# Patient Record
Sex: Male | Born: 1955 | Race: White | Hispanic: No | Smoking: Never smoker
Health system: Southern US, Community
[De-identification: ages and names within clinical notes are randomized; demographics above are authoritative.]

## PROBLEM LIST (undated history)

## (undated) DIAGNOSIS — E119 Type 2 diabetes mellitus without complications: Secondary | ICD-10-CM

## (undated) DIAGNOSIS — E785 Hyperlipidemia, unspecified: Secondary | ICD-10-CM

## (undated) DIAGNOSIS — Z973 Presence of spectacles and contact lenses: Secondary | ICD-10-CM

## (undated) DIAGNOSIS — C801 Malignant (primary) neoplasm, unspecified: Secondary | ICD-10-CM

## (undated) DIAGNOSIS — I519 Heart disease, unspecified: Secondary | ICD-10-CM

## (undated) DIAGNOSIS — M545 Low back pain, unspecified: Secondary | ICD-10-CM

## (undated) DIAGNOSIS — K219 Gastro-esophageal reflux disease without esophagitis: Secondary | ICD-10-CM

## (undated) DIAGNOSIS — I499 Cardiac arrhythmia, unspecified: Secondary | ICD-10-CM

## (undated) DIAGNOSIS — I219 Acute myocardial infarction, unspecified: Secondary | ICD-10-CM

## (undated) DIAGNOSIS — M1990 Unspecified osteoarthritis, unspecified site: Secondary | ICD-10-CM

## (undated) DIAGNOSIS — I2510 Atherosclerotic heart disease of native coronary artery without angina pectoris: Secondary | ICD-10-CM

## (undated) DIAGNOSIS — I10 Essential (primary) hypertension: Secondary | ICD-10-CM

## (undated) HISTORY — PX: GANGLION CYST EXCISION: SHX1691

## (undated) HISTORY — DX: Heart disease, unspecified: I51.9

## (undated) HISTORY — DX: Low back pain, unspecified: M54.50

## (undated) HISTORY — DX: Acute myocardial infarction, unspecified: I21.9

## (undated) HISTORY — DX: Type 2 diabetes mellitus without complications: E11.9

## (undated) HISTORY — PX: CARDIAC CATHETERIZATION: SHX172

## (undated) HISTORY — PX: TONSILLECTOMY: SUR1361

## (undated) HISTORY — PX: FINGER GANGLION CYST EXCISION: SHX1636

---

## 1998-01-09 ENCOUNTER — Inpatient Hospital Stay (HOSPITAL_COMMUNITY): Admission: EM | Admit: 1998-01-09 | Discharge: 1998-01-10 | Payer: Self-pay | Admitting: Emergency Medicine

## 2000-09-30 DIAGNOSIS — I219 Acute myocardial infarction, unspecified: Secondary | ICD-10-CM

## 2000-09-30 HISTORY — DX: Acute myocardial infarction, unspecified: I21.9

## 2000-09-30 HISTORY — PX: CARDIAC CATHETERIZATION: SHX172

## 2004-01-06 ENCOUNTER — Encounter: Admission: RE | Admit: 2004-01-06 | Discharge: 2004-01-06 | Payer: Self-pay | Admitting: Sports Medicine

## 2004-01-26 ENCOUNTER — Encounter: Admission: RE | Admit: 2004-01-26 | Discharge: 2004-01-26 | Payer: Self-pay | Admitting: Sports Medicine

## 2004-02-14 ENCOUNTER — Encounter: Admission: RE | Admit: 2004-02-14 | Discharge: 2004-02-14 | Payer: Self-pay | Admitting: Sports Medicine

## 2007-10-01 HISTORY — PX: SHOULDER ARTHROSCOPY: SHX128

## 2008-09-30 HISTORY — PX: ELBOW ARTHROSCOPY: SUR87

## 2010-09-30 DIAGNOSIS — C801 Malignant (primary) neoplasm, unspecified: Secondary | ICD-10-CM

## 2010-09-30 HISTORY — PX: PARTIAL NEPHRECTOMY: SHX414

## 2010-09-30 HISTORY — DX: Malignant (primary) neoplasm, unspecified: C80.1

## 2010-09-30 HISTORY — PX: ULNAR NERVE REPAIR: SHX2594

## 2010-12-31 ENCOUNTER — Ambulatory Visit
Admission: RE | Admit: 2010-12-31 | Discharge: 2010-12-31 | Disposition: A | Discharge status: Home / Self Care | Payer: BC Managed Care – PPO | Source: Ambulatory Visit | Attending: Orthopedic Surgery | Admitting: Orthopedic Surgery

## 2010-12-31 ENCOUNTER — Other Ambulatory Visit: Payer: Self-pay | Admitting: Orthopedic Surgery

## 2010-12-31 ENCOUNTER — Encounter (HOSPITAL_BASED_OUTPATIENT_CLINIC_OR_DEPARTMENT_OTHER)
Admission: RE | Admit: 2010-12-31 | Discharge: 2010-12-31 | Disposition: A | Discharge status: Home / Self Care | Payer: BC Managed Care – PPO | Source: Ambulatory Visit | Attending: Orthopedic Surgery | Admitting: Orthopedic Surgery

## 2010-12-31 DIAGNOSIS — Z01811 Encounter for preprocedural respiratory examination: Secondary | ICD-10-CM

## 2010-12-31 LAB — BASIC METABOLIC PANEL
Chloride: 96 mEq/L (ref 96–112)
Creatinine, Ser: 1.03 mg/dL (ref 0.4–1.5)
GFR calc non Af Amer: >60 mL/min (ref >60)
Potassium: 3.7 mEq/L (ref 3.5–5.1)

## 2011-01-03 ENCOUNTER — Ambulatory Visit (HOSPITAL_BASED_OUTPATIENT_CLINIC_OR_DEPARTMENT_OTHER)
Admission: RE | Admit: 2011-01-03 | Discharge: 2011-01-03 | Disposition: A | Discharge status: Home / Self Care | Payer: BC Managed Care – PPO | Source: Ambulatory Visit | Attending: Orthopedic Surgery | Admitting: Orthopedic Surgery

## 2011-01-03 DIAGNOSIS — Z01812 Encounter for preprocedural laboratory examination: Secondary | ICD-10-CM | POA: Insufficient documentation

## 2011-01-03 DIAGNOSIS — G5620 Lesion of ulnar nerve, unspecified upper limb: Secondary | ICD-10-CM | POA: Insufficient documentation

## 2011-01-03 DIAGNOSIS — I10 Essential (primary) hypertension: Secondary | ICD-10-CM | POA: Insufficient documentation

## 2011-01-03 DIAGNOSIS — I2510 Atherosclerotic heart disease of native coronary artery without angina pectoris: Secondary | ICD-10-CM | POA: Insufficient documentation

## 2011-01-08 NOTE — Op Note (Signed)
NAME:  Adam Roach, Adam Roach              ACCOUNT NO.:  [id number]  MEDICAL RECORD NO.:  [id number]          PATIENT TYPE:  LOCATION:                                 FACILITY:  PHYSICIAN:  Katy Fitch. Gianni Fuchs, M.D.      DATE OF BIRTH:  DATE OF PROCEDURE:  01/03/2011 DATE OF DISCHARGE:                              OPERATIVE REPORT   PREOPERATIVE DIAGNOSES:  Chronic left ulnar neuropathy with abnormal electrodiagnostic studies across the elbow and proximal forearm, repeated status post prior ulnar nerve decompression, anterior transposition, and "limited medial epicondylectomy."  POSTOPERATIVE DIAGNOSES:  Profound scarring of ulnar nerve to scar at medial epicondylectomy and extreme scarring and compression neuropathy with traction neuropathy of ulnar nerve at heads of flexor carpi ulnaris and relatively acute angle of entry into the head of the flexor carpi ulnaris due to scar contracture.  Also, we identified an intact medial brachial intermuscular septum that may have been causing proximal compression deep to the scar where the ulnar nerve crossed the medial condyle of the distal humerus.  OPERATION: 1. Re-exploration and extensive external neurolysis of left ulnar     nerve across medial left elbow, status post prior subcutaneous     anterior transposition of the ulnar nerve, and "limited medial     epicondylectomy." 2. Resection of persistent medial brachial intermuscular septum. 3. Creation of subcutaneous adipose tissue channel for transposed     ulnar nerve with extensive dissection of ulnar nerve at heads of     flexor carpi ulnaris, correcting a rather acute angulation of the     nerve upon entry into the head of flexor carpi ulnaris by resection     of the anterior fascia of the flexor carpi ulnaris leaving a muscle     bed adjacent to the ulnar nerve.  OPERATING SURGEON:  Katy Fitch. Cherith Tewell, MD.  ASSISTANT:  Annye Rusk, PA-C.  ANESTHESIA:  General by  LMA.  SUPERVISING ANESTHESIOLOGIST:  Zenon Mayo, MD.  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  He has had chronic impairment of his left arm following a shoulder procedure in which he had an indwelling plexus catheter for perioperative analgesia, complicated by infiltration of local anesthesia and development of a postoperative ulnar nerve impairment.  He subsequently had electrodiagnostic studies, which revealed evidence of left ulnar neuropathy.  On March 16, 2008, Dr. Stanford Scotland of [place], [facility] performed an in situ decompression of the nerve followed by anterior subcutaneous transposition and a "limited medial epicondylectomy."  Dr. Stanford Scotland created some type of soft tissue barrier to prevent posterior relocation of the nerve.  Postoperatively, [name] experienced weakness of pinching grasp in the left hand and persistent sensibility in the region of the proper ulnar distribution of the palm, ring, and small fingers.  He had subsequent electrodiagnostic studies completed in [facility], which revealed persistent slowing of the ulnar nerve across the elbow and abnormal electrical activity in the abductor digiti minimi.  He has tolerated this symptoms for more than 2-1/2 years, but was quite impaired at work due to  impairment of pinch and grasp and was bothered by his chronic dysesthesias.  He presented for an upper extremity orthopaedic consult on December 12, 2010 following a suggestion of some of our other patients who he was familiar with in [facility].  On examination, we noted weakness of pinch grasp and altered ulnar sensibility in the left hand.  He had a positive Tinel sign at the medial epicondyle, at the presumed location of his ulnar nerve, and a visible degree of intrinsic atrophy with a positive Wartenberg sign and borderline Froment sign.  We reviewed  multiple electrodiagnostic studies from [facility] and concluded that he had both objective impairment of ulnar nerve motor and sensory function on clinical examination as well as documented electrodiagnostic impairment persisting.  We advised [name] that at times there are technical issues with anterior transposition of the ulnar nerve and that while we could make no promises, we would be willing to reexplore his nerve in an effort to neurolyse the nerve, be certain there was no residual sites of compression and try to place the nerve in an adipose bed that would allow nerve gliding and possibly improve function.  As he had had chronic impairment, he agreed that he was willing to proceed at this time.  Once again, we reminded him that we have no spare parts and might find an injured nerve that simply will not recover due to its current state of ill health.  Under these circumstances, he is willing to proceed at this time with a best effort on our part to explore his nerve, neurolyse the nerve and create a more favorable bed for the nerve to reside in.  We pointed out that at times we will find that the medial brachial intermuscular septum can be a problem and in our own personal experience of re-exploring ulnar nerves issues at the heads of the flexor carpi ulnaris are often problematic.  Given the circumstance of a combined epicondylectomy and subcutaneous transposition, was of the opinion that we might find a very significant scared bed.  After informed consent, he was brought to the operating room at this time.  DESCRIPTION OF PROCEDURE:  [name] was interviewed by Dr. Sampson Goon of the Anesthesia Service and after detailed informed consent, selected general anesthesia by LMA technique.  Dr. Sampson Goon had discussed a possible block, however, due to his difficulties of the prior indwelling catheter, an uncertainty as to whether or not there is an element of  plexus dysfunction contributing to his neuropathy, I have recommended that this be deferred.  Mr. Tabora is brought to room #1 of the [facility] and placed in supine position on the operating table.  Under Dr. Jarrett Ables direct supervision, general anesthesia by LMA technique was induced.  The left arm was then prepped with Betadine soap solution and sterilely draped.  IV antibiotic were not provided due to multiple drug allergies. We will provide oral doxycycline as a prophylactic antibiotic postoperatively.  After routine surgical time-out, Mr. Fan left arm was exsanguinated with an Esmarch bandage and the arterial tourniquet was inflated to 220 mmHg.  Procedure commenced with a resection of the prior surgical scar. Meticulous subcutaneous dissection in virgin tissue proximally and distally allowed identification of the ulnar nerve adjacent to the triceps muscle superiorly, and deep to the head of the flexor carpi ulnaris distally.  We then performed a meticulous dissection from distal to proximal and proximal to distal overlying the nerve at an epineurial plane.  There was dense  scarring in the nerve at the proximal medial epicondyle and irregularity of the epicondyle where there are apparently had been some decortication with a rongeur.  The medial brachial intermuscular septum was intact and there was clearly compression where the nerve crossed the posterior aspect of the epicondyle and the septum. This was carefully neurolysed.  With extreme care, the nerve was followed distally and found be densely adherent over a 3-cm segment of the epicondyle at the fascia overlying the common flexor origin.  At the head of the flexor carpi ulnaris, the nerve had about a 60-degree acute angulation due to scarring at the heads of the flexor carpi ulnaris. This was carefully neurolysed and care was taken to preserve the motor branches to the flexor carpi ulnaris as well as  the articular branch. As we dissected proximally, I found a neuroma bulb on the posterior aspect of the nerve, which I simply left intact and elevated off the flexor origin fascia.  The nerve was mobilized over distance of approximately 6 cm with silicone vessel loops and transposed anteriorly. The bed of the prior transposition was carefully inspected and found be rather irregular at the site of new bone growth of the epicondylectomy. This was gently smoothed and care was taken to transpose the nerve to an adipose bed approximately 1 cm more anterior and ulnar in direction. The intermuscular septum was resected proximally with a cutting cautery and dissected over distance of 4 cm proximally to the metaphyseal vessels of the distal humerus.  Hemostasis achieved with bipolar cautery and saline irrigation.  The nerve was transposed including the small neuroma bulb noted into the bed of near normal appearing fat followed by creation of a wall posterior to the nerve by suturing the subcutaneous fat to the fascia of the flexor pronator origin.  Care was taken to use a Glorious Peach as well as my finger to palpate around the nerve and sure that there was at least 1 cm of free space around the nerve.  There was no evidence of kinking at the distal insertion into the head of the flexor carpi ulnaris after release of the fascia of the flexor carpi ulnaris and resection of the tendinous origin of the muscle over 1-cm wide area at the distal epicondyle.  Proximally, we assured that there was no compression where the nerve passed over the epicondyle.  The elbow was ranged 0 to 130 degrees flexion and no sign of kinking or other impairment was noted.  As a portion of our rehab strategy, we will begin immediate active range of motion exercises of the elbow on the first postoperative day.  The tourniquet was released and bleeding points were electrocauterized bipolar current.  The skin was repaired with  subcutaneous 4-0 Vicryl and interdermal 3-0 Prolene with Steri-Strips.  A 2% lidocaine was infiltrated for postop analgesia.  For aftercare, Mr. Galea was provided prescription for Percocet 5 mg one p.o. q.4-6 hours p.r.n. pain, 30 tablets without refill, also doxycycline 100 mg one p.o. b.i.d. x4 days as prophylactic antibiotic.  We will see him back for followup in 1 week for dressing change, discussion of his surgical findings, and initiation of postoperative rehabilitation program.     Katy Fitch. Laiklynn Raczynski, M.D.     RVS/MEDQ  D:  01/03/2011  T:  01/04/2011  Job:  [id number]  Electronically Signed by Josephine Igo M.D. on 01/08/2011 02:51:14 PM

## 2011-08-27 HISTORY — PX: PARTIAL NEPHRECTOMY: SHX414

## 2012-11-13 IMAGING — CR DG CHEST 2V
2 series · 2 of 2 positions shown · non-contrast
Comparison: None.

CLINICAL DATA: Preop.

CHEST - 2 VIEW

[w chest pa]
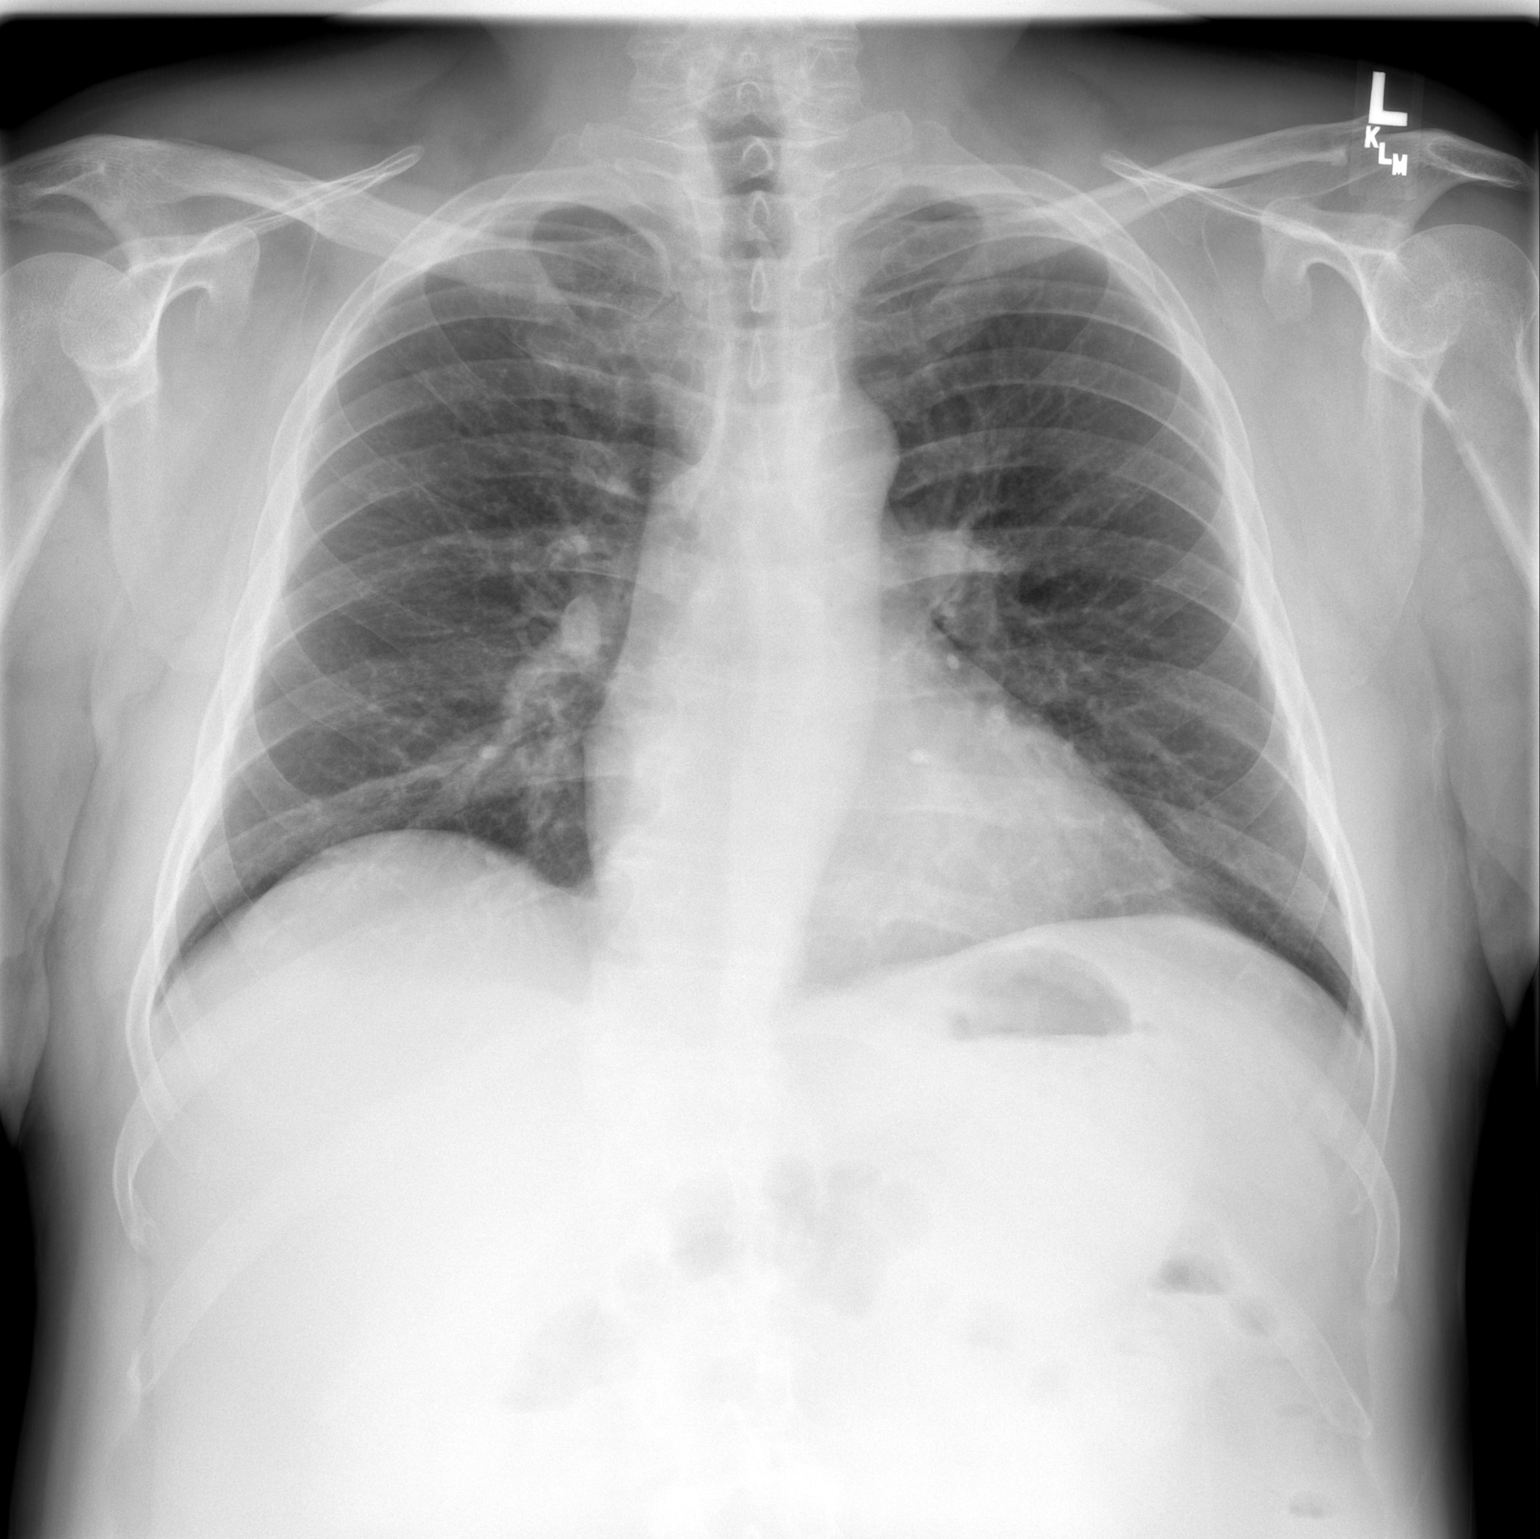

[w chest lat]
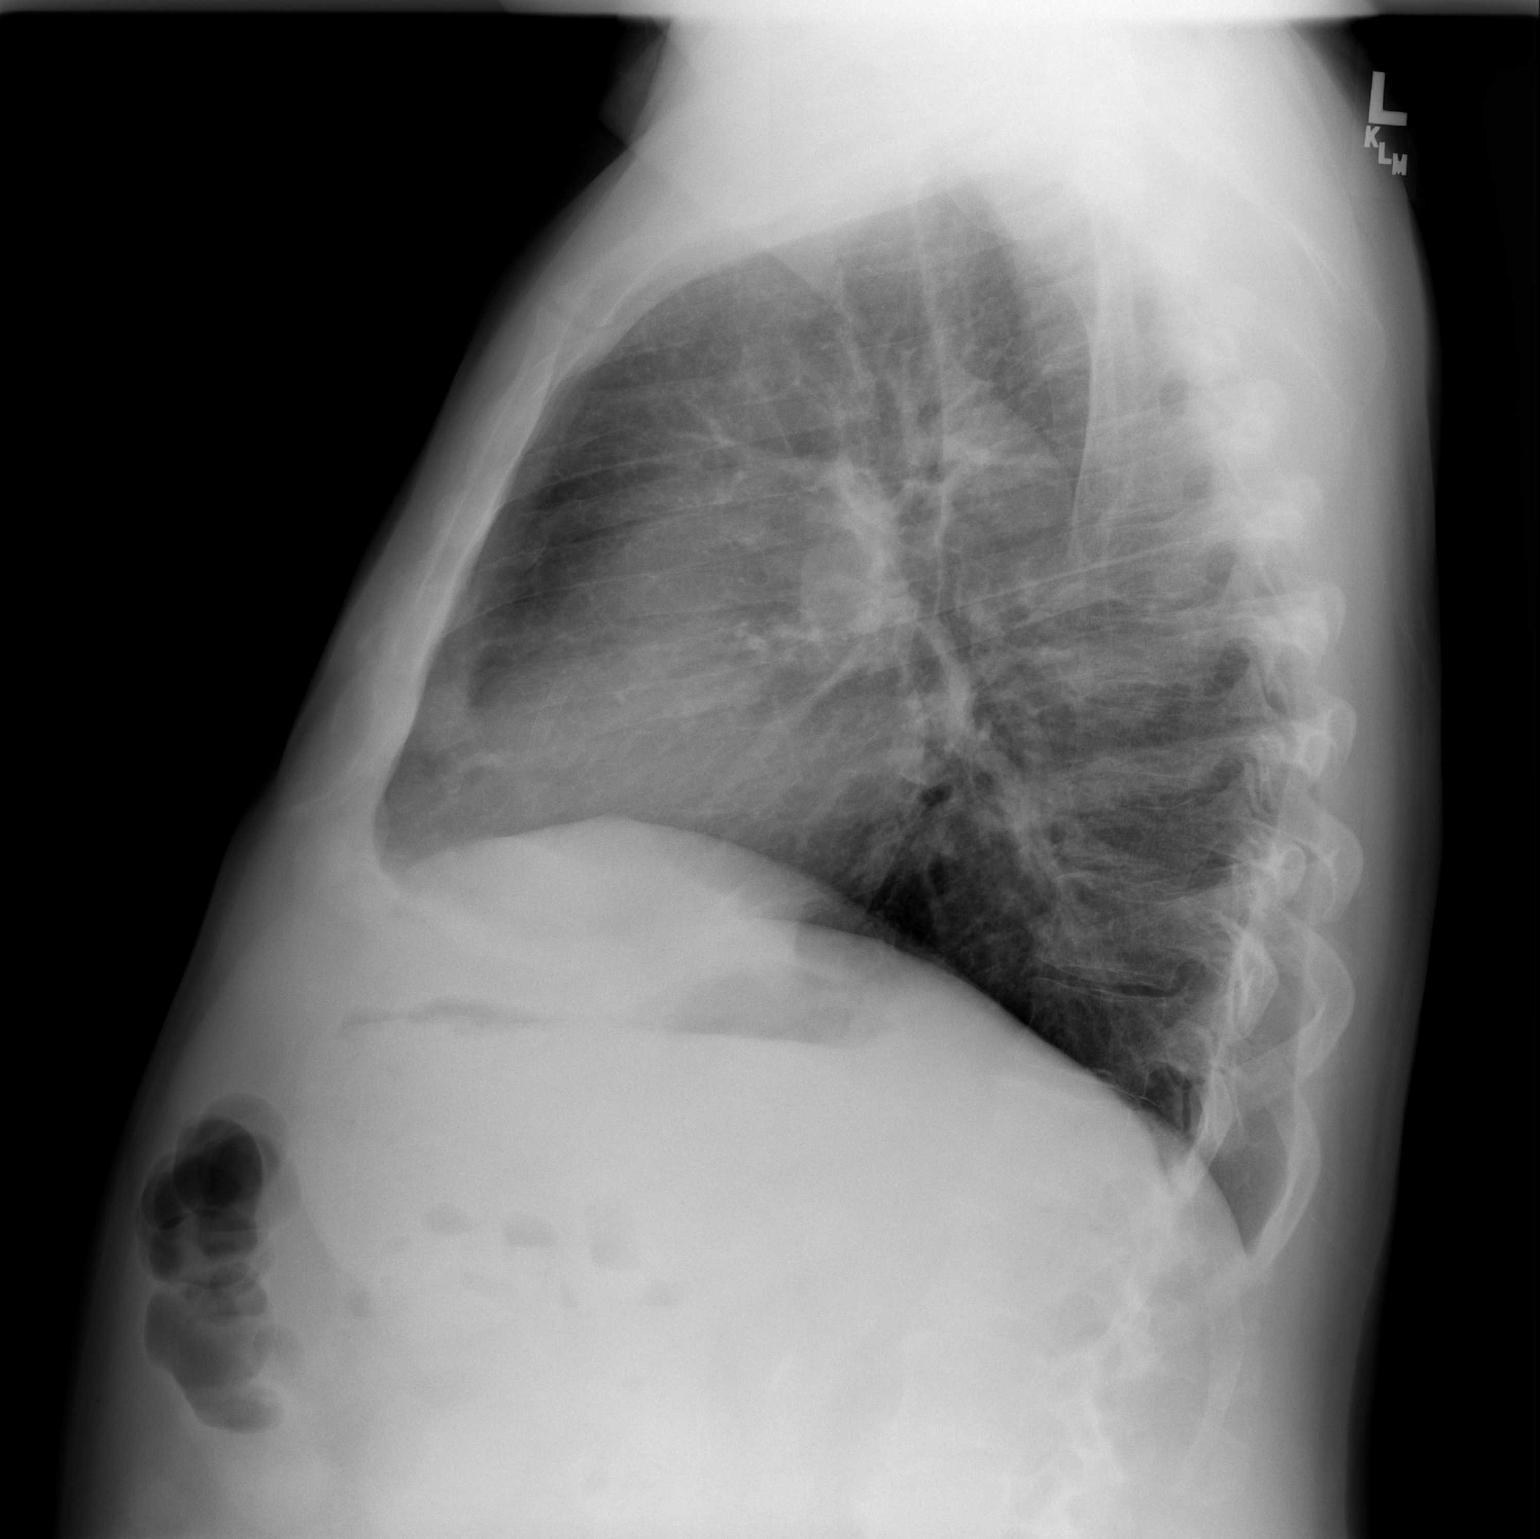

[2 of 2 positions shown; findings below may reference images not displayed]

FINDINGS: Trachea is midline.  Heart size normal.  Lungs are
somewhat low in volume but clear.  No pleural fluid.
IMPRESSION: No acute findings.

## 2014-12-13 ENCOUNTER — Other Ambulatory Visit: Payer: Self-pay | Admitting: Orthopedic Surgery

## 2014-12-14 ENCOUNTER — Encounter (HOSPITAL_BASED_OUTPATIENT_CLINIC_OR_DEPARTMENT_OTHER): Payer: Self-pay | Admitting: *Deleted

## 2014-12-14 NOTE — Progress Notes (Signed)
Pt had stents 2002-has been here several times for wrist and elbow surgeries-called for recent ekg-cardiac notes-will ned istat

## 2014-12-20 ENCOUNTER — Ambulatory Visit (HOSPITAL_BASED_OUTPATIENT_CLINIC_OR_DEPARTMENT_OTHER)
Admission: RE | Admit: 2014-12-20 | Discharge: 2014-12-20 | Disposition: A | Discharge status: Home / Self Care | Payer: BLUE CROSS/BLUE SHIELD | Source: Ambulatory Visit | Attending: Orthopedic Surgery | Admitting: Orthopedic Surgery

## 2014-12-20 ENCOUNTER — Encounter (HOSPITAL_BASED_OUTPATIENT_CLINIC_OR_DEPARTMENT_OTHER)
Admission: RE | Disposition: A | Discharge status: Home / Self Care | Payer: Self-pay | Source: Ambulatory Visit | Attending: Orthopedic Surgery

## 2014-12-20 ENCOUNTER — Encounter (HOSPITAL_BASED_OUTPATIENT_CLINIC_OR_DEPARTMENT_OTHER): Payer: Self-pay | Admitting: Certified Registered"

## 2014-12-20 ENCOUNTER — Ambulatory Visit (HOSPITAL_BASED_OUTPATIENT_CLINIC_OR_DEPARTMENT_OTHER): Payer: BLUE CROSS/BLUE SHIELD | Admitting: Certified Registered"

## 2014-12-20 DIAGNOSIS — Z955 Presence of coronary angioplasty implant and graft: Secondary | ICD-10-CM | POA: Diagnosis not present

## 2014-12-20 DIAGNOSIS — G5621 Lesion of ulnar nerve, right upper limb: Secondary | ICD-10-CM | POA: Insufficient documentation

## 2014-12-20 DIAGNOSIS — M1990 Unspecified osteoarthritis, unspecified site: Secondary | ICD-10-CM | POA: Diagnosis not present

## 2014-12-20 DIAGNOSIS — Z881 Allergy status to other antibiotic agents status: Secondary | ICD-10-CM | POA: Diagnosis not present

## 2014-12-20 DIAGNOSIS — G589 Mononeuropathy, unspecified: Secondary | ICD-10-CM | POA: Diagnosis not present

## 2014-12-20 DIAGNOSIS — Z7982 Long term (current) use of aspirin: Secondary | ICD-10-CM | POA: Diagnosis not present

## 2014-12-20 DIAGNOSIS — M67431 Ganglion, right wrist: Secondary | ICD-10-CM | POA: Diagnosis not present

## 2014-12-20 DIAGNOSIS — Z905 Acquired absence of kidney: Secondary | ICD-10-CM | POA: Insufficient documentation

## 2014-12-20 DIAGNOSIS — K219 Gastro-esophageal reflux disease without esophagitis: Secondary | ICD-10-CM | POA: Insufficient documentation

## 2014-12-20 DIAGNOSIS — Z91041 Radiographic dye allergy status: Secondary | ICD-10-CM | POA: Diagnosis not present

## 2014-12-20 DIAGNOSIS — E785 Hyperlipidemia, unspecified: Secondary | ICD-10-CM | POA: Diagnosis not present

## 2014-12-20 DIAGNOSIS — G5601 Carpal tunnel syndrome, right upper limb: Secondary | ICD-10-CM | POA: Diagnosis not present

## 2014-12-20 DIAGNOSIS — I2510 Atherosclerotic heart disease of native coronary artery without angina pectoris: Secondary | ICD-10-CM | POA: Diagnosis not present

## 2014-12-20 DIAGNOSIS — I10 Essential (primary) hypertension: Secondary | ICD-10-CM | POA: Diagnosis not present

## 2014-12-20 DIAGNOSIS — Z880 Allergy status to penicillin: Secondary | ICD-10-CM | POA: Diagnosis not present

## 2014-12-20 HISTORY — DX: Gastro-esophageal reflux disease without esophagitis: K21.9

## 2014-12-20 HISTORY — PX: CARPAL TUNNEL RELEASE: SHX101

## 2014-12-20 HISTORY — PX: ULNAR NERVE TRANSPOSITION: SHX2595

## 2014-12-20 HISTORY — DX: Hyperlipidemia, unspecified: E78.5

## 2014-12-20 HISTORY — PX: GANGLION CYST EXCISION: SHX1691

## 2014-12-20 HISTORY — DX: Atherosclerotic heart disease of native coronary artery without angina pectoris: I25.10

## 2014-12-20 HISTORY — DX: Unspecified osteoarthritis, unspecified site: M19.90

## 2014-12-20 HISTORY — DX: Malignant (primary) neoplasm, unspecified: C80.1

## 2014-12-20 HISTORY — DX: Presence of spectacles and contact lenses: Z97.3

## 2014-12-20 HISTORY — DX: Essential (primary) hypertension: I10

## 2014-12-20 LAB — POCT I-STAT, CHEM 8
BUN: 10 mg/dL (ref 6–23)
CALCIUM ION: 1.04 mmol/L — AB (ref 1.12–1.23)
Chloride: 101 mmol/L (ref 96–112)
Creatinine, Ser: 0.6 mg/dL (ref 0.50–1.35)
Glucose, Bld: 235 mg/dL — ABNORMAL HIGH (ref 70–99)
HEMATOCRIT: 43 % (ref 39.0–52.0)
Hemoglobin: 14.6 g/dL (ref 13.0–17.0)
POTASSIUM: 4 mmol/L (ref 3.5–5.1)
Sodium: 137 mmol/L (ref 135–145)
TCO2: 21 mmol/L (ref 0–100)

## 2014-12-20 SURGERY — ULNAR NERVE DECOMPRESSION/TRANSPOSITION
Anesthesia: General | Site: Wrist | Laterality: Right

## 2014-12-20 MED ORDER — FENTANYL CITRATE 0.05 MG/ML IJ SOLN
INTRAMUSCULAR | Status: DC | PRN
  Administered 2014-12-20: 50 ug via INTRAVENOUS

## 2014-12-20 MED ORDER — HYDROMORPHONE HCL 1 MG/ML IJ SOLN
INTRAMUSCULAR | Status: AC
  Filled 2014-12-20: qty 1

## 2014-12-20 MED ORDER — MIDAZOLAM HCL 2 MG/2ML IJ SOLN
INTRAMUSCULAR | Status: AC
  Filled 2014-12-20: qty 2

## 2014-12-20 MED ORDER — ONDANSETRON HCL 4 MG/2ML IJ SOLN
INTRAMUSCULAR | Status: DC | PRN
  Administered 2014-12-20: 4 mg via INTRAVENOUS

## 2014-12-20 MED ORDER — PROPOFOL 10 MG/ML IV EMUL
INTRAVENOUS | Status: AC
  Filled 2014-12-20: qty 50

## 2014-12-20 MED ORDER — LACTATED RINGERS IV SOLN
INTRAVENOUS | Status: DC
  Administered 2014-12-20: 08:00:00 via INTRAVENOUS

## 2014-12-20 MED ORDER — OXYCODONE HCL 5 MG PO TABS
5.0000 mg | ORAL_TABLET | Freq: Once | ORAL | Status: AC | PRN
  Administered 2014-12-20: 5 mg via ORAL

## 2014-12-20 MED ORDER — BUPIVACAINE HCL (PF) 0.25 % IJ SOLN
INTRAMUSCULAR | Status: AC
  Filled 2014-12-20: qty 30

## 2014-12-20 MED ORDER — BUPIVACAINE HCL (PF) 0.25 % IJ SOLN
INTRAMUSCULAR | Status: DC | PRN
  Administered 2014-12-20: 17 mL

## 2014-12-20 MED ORDER — LIDOCAINE HCL (CARDIAC) 20 MG/ML IV SOLN
INTRAVENOUS | Status: DC | PRN
  Administered 2014-12-20: 30 mg via INTRAVENOUS

## 2014-12-20 MED ORDER — CLINDAMYCIN PHOSPHATE 600 MG/50ML IV SOLN
INTRAVENOUS | Status: AC
  Filled 2014-12-20: qty 50

## 2014-12-20 MED ORDER — CLINDAMYCIN PHOSPHATE 600 MG/50ML IV SOLN
INTRAVENOUS | Status: DC | PRN
  Administered 2014-12-20: 600 mg via INTRAVENOUS

## 2014-12-20 MED ORDER — MIDAZOLAM HCL 2 MG/2ML IJ SOLN: 1.0000 mg | INTRAMUSCULAR | Status: DC | PRN

## 2014-12-20 MED ORDER — OXYCODONE HCL 5 MG PO TABS
ORAL_TABLET | ORAL | Status: AC
  Filled 2014-12-20: qty 1

## 2014-12-20 MED ORDER — HYDROMORPHONE HCL 1 MG/ML IJ SOLN
0.2500 mg | INTRAMUSCULAR | Status: DC | PRN
  Administered 2014-12-20 (×2): 0.5 mg via INTRAVENOUS
  Administered 2014-12-20: 0.25 mg via INTRAVENOUS

## 2014-12-20 MED ORDER — FENTANYL CITRATE 0.05 MG/ML IJ SOLN
INTRAMUSCULAR | Status: AC
  Filled 2014-12-20: qty 6

## 2014-12-20 MED ORDER — PROPOFOL 10 MG/ML IV BOLUS
INTRAVENOUS | Status: DC | PRN
  Administered 2014-12-20: 200 mg via INTRAVENOUS

## 2014-12-20 MED ORDER — CHLORHEXIDINE GLUCONATE 4 % EX LIQD: 60.0000 mL | Freq: Once | CUTANEOUS | Status: DC

## 2014-12-20 MED ORDER — MIDAZOLAM HCL 5 MG/5ML IJ SOLN
INTRAMUSCULAR | Status: DC | PRN
  Administered 2014-12-20: 2 mg via INTRAVENOUS

## 2014-12-20 MED ORDER — OXYCODONE HCL 5 MG/5ML PO SOLN: 5.0000 mg | Freq: Once | ORAL | Status: AC | PRN

## 2014-12-20 MED ORDER — CEFAZOLIN SODIUM-DEXTROSE 2-3 GM-% IV SOLR
INTRAVENOUS | Status: AC
  Filled 2014-12-20: qty 50

## 2014-12-20 MED ORDER — PROMETHAZINE HCL 25 MG/ML IJ SOLN: 6.2500 mg | INTRAMUSCULAR | Status: DC | PRN

## 2014-12-20 MED ORDER — OXYCODONE-ACETAMINOPHEN 5-325 MG PO TABS: ORAL_TABLET | ORAL | Status: DC

## 2014-12-20 MED ORDER — FENTANYL CITRATE 0.05 MG/ML IJ SOLN: 50.0000 ug | INTRAMUSCULAR | Status: DC | PRN

## 2014-12-20 MED ORDER — DEXAMETHASONE SODIUM PHOSPHATE 10 MG/ML IJ SOLN
INTRAMUSCULAR | Status: DC | PRN
  Administered 2014-12-20: 10 mg via INTRAVENOUS

## 2014-12-20 SURGICAL SUPPLY — 59 items
BANDAGE ELASTIC 3 VELCRO ST LF (GAUZE/BANDAGES/DRESSINGS) ×5 IMPLANT
BANDAGE ELASTIC 4 VELCRO ST LF (GAUZE/BANDAGES/DRESSINGS) ×3 IMPLANT
BLADE MINI RND TIP GREEN BEAV (BLADE) IMPLANT
BLADE SURG 15 STRL LF DISP TIS (BLADE) ×4 IMPLANT
BLADE SURG 15 STRL SS (BLADE) ×6
BNDG CMPR 9X4 STRL LF SNTH (GAUZE/BANDAGES/DRESSINGS) ×2
BNDG ESMARK 4X9 LF (GAUZE/BANDAGES/DRESSINGS) ×3 IMPLANT
BNDG GAUZE ELAST 4 BULKY (GAUZE/BANDAGES/DRESSINGS) ×3 IMPLANT
CHLORAPREP W/TINT 26ML (MISCELLANEOUS) ×3 IMPLANT
CORDS BIPOLAR (ELECTRODE) ×3 IMPLANT
COVER BACK TABLE 60X90IN (DRAPES) ×3 IMPLANT
COVER MAYO STAND STRL (DRAPES) ×4 IMPLANT
CUFF TOURN SGL LL 18 NRW (TOURNIQUET CUFF) ×3 IMPLANT
CUFF TOURNIQUET SINGLE 18IN (TOURNIQUET CUFF) ×2 IMPLANT
DECANTER SPIKE VIAL GLASS SM (MISCELLANEOUS) IMPLANT
DRAPE EXTREMITY T 121X128X90 (DRAPE) ×3 IMPLANT
DRAPE SURG 17X23 STRL (DRAPES) ×3 IMPLANT
DRSG PAD ABDOMINAL 8X10 ST (GAUZE/BANDAGES/DRESSINGS) ×3 IMPLANT
GAUZE SPONGE 4X4 12PLY STRL (GAUZE/BANDAGES/DRESSINGS) ×3 IMPLANT
GAUZE SPONGE 4X4 16PLY XRAY LF (GAUZE/BANDAGES/DRESSINGS) IMPLANT
GAUZE XEROFORM 1X8 LF (GAUZE/BANDAGES/DRESSINGS) ×3 IMPLANT
GLOVE BIO SURGEON STRL SZ7.5 (GLOVE) ×3 IMPLANT
GLOVE BIOGEL PI IND STRL 7.5 (GLOVE) IMPLANT
GLOVE BIOGEL PI IND STRL 8 (GLOVE) ×2 IMPLANT
GLOVE BIOGEL PI IND STRL 8.5 (GLOVE) ×2 IMPLANT
GLOVE BIOGEL PI INDICATOR 7.5 (GLOVE) ×1
GLOVE BIOGEL PI INDICATOR 8 (GLOVE) ×1
GLOVE BIOGEL PI INDICATOR 8.5 (GLOVE) ×1
GLOVE SURG ORTHO 8.0 STRL STRW (GLOVE) ×3 IMPLANT
GLOVE SURG SS PI 7.5 STRL IVOR (GLOVE) ×1 IMPLANT
GOWN STRL REUS W/ TWL LRG LVL3 (GOWN DISPOSABLE) ×2 IMPLANT
GOWN STRL REUS W/TWL LRG LVL3 (GOWN DISPOSABLE) ×3
GOWN STRL REUS W/TWL XL LVL3 (GOWN DISPOSABLE) ×6 IMPLANT
NDL HYPO 25X1 1.5 SAFETY (NEEDLE) IMPLANT
NEEDLE HYPO 25X1 1.5 SAFETY (NEEDLE) ×3 IMPLANT
NS IRRIG 1000ML POUR BTL (IV SOLUTION) ×3 IMPLANT
PACK BASIN DAY SURGERY FS (CUSTOM PROCEDURE TRAY) ×3 IMPLANT
PAD CAST 3X4 CTTN HI CHSV (CAST SUPPLIES) ×2 IMPLANT
PAD CAST 4YDX4 CTTN HI CHSV (CAST SUPPLIES) ×2 IMPLANT
PADDING CAST ABS 4INX4YD NS (CAST SUPPLIES)
PADDING CAST ABS COTTON 4X4 ST (CAST SUPPLIES) ×2 IMPLANT
PADDING CAST COTTON 3X4 STRL (CAST SUPPLIES) ×3
PADDING CAST COTTON 4X4 STRL (CAST SUPPLIES) ×3
SLEEVE SCD COMPRESS KNEE MED (MISCELLANEOUS) ×3 IMPLANT
SLING ARM MED ADULT FOAM STRAP (SOFTGOODS) ×1 IMPLANT
SPLINT FAST PLASTER 5X30 (CAST SUPPLIES)
SPLINT PLASTER CAST FAST 5X30 (CAST SUPPLIES) IMPLANT
SPLINT PLASTER CAST XFAST 3X15 (CAST SUPPLIES) IMPLANT
SPLINT PLASTER XTRA FASTSET 3X (CAST SUPPLIES) ×30
STOCKINETTE 4X48 STRL (DRAPES) ×3 IMPLANT
SUT ETHILON 4 0 PS 2 18 (SUTURE) ×5 IMPLANT
SUT VIC AB 2-0 SH 27 (SUTURE) ×3
SUT VIC AB 2-0 SH 27XBRD (SUTURE) ×2 IMPLANT
SUT VIC AB 4-0 P2 18 (SUTURE) ×1 IMPLANT
SUT VICRYL 4-0 PS2 18IN ABS (SUTURE) IMPLANT
SYR BULB 3OZ (MISCELLANEOUS) ×3 IMPLANT
SYR CONTROL 10ML LL (SYRINGE) ×1 IMPLANT
TOWEL OR 17X24 6PK STRL BLUE (TOWEL DISPOSABLE) ×6 IMPLANT
UNDERPAD 30X30 INCONTINENT (UNDERPADS AND DIAPERS) ×3 IMPLANT

## 2014-12-20 NOTE — Transfer of Care (Signed)
Immediate Anesthesia Transfer of Care Note  Patient: Adam Roach  Procedure(s) Performed: Procedure(s): RIGHT ULNAR NERVE DECOMPRESSION, TRANSPOSITION (Right) RIGHT CARPAL TUNNEL RELEASE (Right) REMOVAL GANGLION OF WRIST (Right)  Patient Location: PACU  Anesthesia Type:General  Level of Consciousness: awake, alert , oriented and patient cooperative  Airway & Oxygen Therapy: Patient Spontanous Breathing and Patient connected to face mask oxygen  Post-op Assessment: Report given to RN and Post -op Vital signs reviewed and stable  Post vital signs: Reviewed and stable  Last Vitals:  Filed Vitals:   12/20/14 0717  BP:   Pulse: 85  Temp: 36.4 C  Resp: 20    Complications: No apparent anesthesia complications

## 2014-12-20 NOTE — Anesthesia Preprocedure Evaluation (Addendum)
Anesthesia Evaluation  Patient identified by MRN, date of birth, ID band Patient awake    Reviewed: Allergy & Precautions, NPO status   Airway Mallampati: I  TM Distance: >3 FB Neck ROM: Full    Dental  (+) Teeth Intact, Dental Advisory Given   Pulmonary neg pulmonary ROS,  breath sounds clear to auscultation        Cardiovascular hypertension, Pt. on medications + CAD and + Cardiac Stents Rhythm:Regular Rate:Normal     Neuro/Psych negative neurological ROS     GI/Hepatic Neg liver ROS, GERD-  ,  Endo/Other  negative endocrine ROS  Renal/GU S/p partial nephrectomy     Musculoskeletal  (+) Arthritis -,   Abdominal   Peds  Hematology negative hematology ROS (+)   Anesthesia Other Findings   Reproductive/Obstetrics                            Anesthesia Physical Anesthesia Plan  ASA: II  Anesthesia Plan: General   Post-op Pain Management:    Induction: Intravenous  Airway Management Planned: LMA  Additional Equipment:   Intra-op Plan:   Post-operative Plan: Extubation in OR  Informed Consent: I have reviewed the patients History and Physical, chart, labs and discussed the procedure including the risks, benefits and alternatives for the proposed anesthesia with the patient or authorized representative who has indicated his/her understanding and acceptance.   Dental advisory given  Plan Discussed with: CRNA  Anesthesia Plan Comments:         Anesthesia Quick Evaluation

## 2014-12-20 NOTE — Brief Op Note (Signed)
12/20/2014  10:31 AM  PATIENT:  Adam Roach  59 y.o. male  PRE-OPERATIVE DIAGNOSIS:  RIGHT ULNAR NERVE COMPRESSION/RIGHT CARPAL TUNNEL SYNDROME  POST-OPERATIVE DIAGNOSIS:  Right Ulnar Nerve Compression, Carpal Tunnel Syndrome, and Ganglion Cyst  PROCEDURE:  Procedure(s): RIGHT ULNAR NERVE DECOMPRESSION, TRANSPOSITION (Right) RIGHT CARPAL TUNNEL RELEASE (Right) REMOVAL GANGLION OF WRIST (Right)  SURGEON:  Surgeon(s) and Role:    * Leanora Cover, MD - Primary    * Daryll Brod, MD - Assisting  PHYSICIAN ASSISTANT:   ASSISTANTS: Daryll Brod, MD   ANESTHESIA:   general  EBL:  Total I/O In: 1500 [I.V.:1500] Out: -   BLOOD ADMINISTERED:none  DRAINS: none   LOCAL MEDICATIONS USED:  MARCAINE     SPECIMEN:  No Specimen  DISPOSITION OF SPECIMEN:  N/A  COUNTS:  YES  TOURNIQUET:   Total Tourniquet Time Documented: Upper Arm (Right) - 85 minutes Total: Upper Arm (Right) - 85 minutes   DICTATION: .Other Dictation: Dictation Number [id number]  PLAN OF CARE: Discharge to home after PACU  PATIENT DISPOSITION:  PACU - hemodynamically stable.

## 2014-12-20 NOTE — Anesthesia Procedure Notes (Signed)
Procedure Name: LMA Insertion Date/Time: 12/20/2014 8:46 AM Performed by: Leanora Cover Pre-anesthesia Checklist: Patient identified, Emergency Drugs available, Suction available and Patient being monitored Patient Re-evaluated:Patient Re-evaluated prior to inductionOxygen Delivery Method: Circle System Utilized Preoxygenation: Pre-oxygenation with 100% oxygen Intubation Type: IV induction Ventilation: Mask ventilation without difficulty LMA: LMA inserted LMA Size: 4.0 Number of attempts: 1 Airway Equipment and Method: Bite block Placement Confirmation: positive ETCO2 Tube secured with: Tape Dental Injury: Teeth and Oropharynx as per pre-operative assessment

## 2014-12-20 NOTE — Anesthesia Postprocedure Evaluation (Signed)
  Anesthesia Post-op Note  Patient: Adam Roach  Procedure(s) Performed: Procedure(s): RIGHT ULNAR NERVE DECOMPRESSION, TRANSPOSITION (Right) RIGHT CARPAL TUNNEL RELEASE (Right) REMOVAL GANGLION OF WRIST (Right)  Patient Location: PACU  Anesthesia Type:General  Level of Consciousness: awake and alert   Airway and Oxygen Therapy: Patient Spontanous Breathing  Post-op Pain: mild  Post-op Assessment: Post-op Vital signs reviewed  Post-op Vital Signs: Reviewed  Last Vitals:  Filed Vitals:   12/20/14 1200  BP: 129/68  Pulse: 71  Temp: 36.8 C  Resp: 18    Complications: No apparent anesthesia complications

## 2014-12-20 NOTE — Discharge Instructions (Addendum)

## 2014-12-20 NOTE — H&P (Signed)
Adam Roach is an 59 y.o. male.   Chief Complaint: right carpal tunnel, cubital tunnel, ganglion HPI: 59 yo with one month of numbness and tingling of right ring and small fingers.  Nocturnal symptoms 4-5 times per week that wake him.  Positive nerve conduction studies.  He wishes to have a carpal tunnel and cubital tunnel release for management of symptoms.  He has also noted a mass at the volar radial aspect of his wrist that comes and goes and is bothersome to him.  He wishes to have this excised at this surgical visit.  Past Medical History  Diagnosis Date  . Coronary artery disease     stent 2002  . Hypertension   . Hyperlipemia   . Wears glasses   . GERD (gastroesophageal reflux disease)   . Arthritis   . Cancer 2012    rt renal    Past Surgical History  Procedure Laterality Date  . Partial nephrectomy  2012    right  . Ulnar nerve repair  2012    left  . Tonsillectomy    . Finger ganglion cyst excision      rt thumb  . Shoulder arthroscopy  2009    left  . Elbow arthroscopy  2010    left  . Ganglion cyst excision      lt wrist  . Cardiac catheterization  2002    stents x2    History reviewed. No pertinent family history. Social History:  reports that he has never smoked. He does not have any smokeless tobacco history on file. He reports that he does not drink alcohol or use illicit drugs.  Allergies:  Allergies  Allergen Reactions  . Penicillins Anaphylaxis  . Ciprofloxacin In D5w Rash  . Contrast Media [Iodinated Diagnostic Agents] Hives  . Vancomycin Rash    Medications Prior to Admission  Medication Sig Dispense Refill  . aspirin 81 MG tablet Take 81 mg by mouth daily.    Marland Kitchen atenolol (TENORMIN) 50 MG tablet Take 50 mg by mouth every evening.    Marland Kitchen atorvastatin (LIPITOR) 20 MG tablet Take 20 mg by mouth daily at 6 PM.    . lisinopril (PRINIVIL,ZESTRIL) 5 MG tablet Take 5 mg by mouth daily.    Marland Kitchen omeprazole (PRILOSEC) 40 MG capsule Take 40 mg by mouth  every evening.      Results for orders placed or performed during the hospital encounter of 12/20/14 (from the past 48 hour(s))  I-STAT, chem 8     Status: Abnormal   Collection Time: 12/20/14  7:41 AM  Result Value Ref Range   Sodium 137 135 - 145 mmol/L   Potassium 4.0 3.5 - 5.1 mmol/L   Chloride 101 96 - 112 mmol/L   BUN 10 6 - 23 mg/dL   Creatinine, Ser 0.60 0.50 - 1.35 mg/dL   Glucose, Bld 235 (H) 70 - 99 mg/dL   Calcium, Ion 1.04 (L) 1.12 - 1.23 mmol/L   TCO2 21 0 - 100 mmol/L   Hemoglobin 14.6 13.0 - 17.0 g/dL   HCT 43.0 39.0 - 52.0 %    No results found.   A comprehensive review of systems was negative except for: Eyes: positive for contacts/glasses  Blood pressure 153/79, pulse 85, temperature 97.6 F (36.4 C), temperature source Oral, resp. rate 20, height 5\' 8"  (1.727 m), weight 81.647 kg (180 lb), SpO2 99 %.  General appearance: alert, cooperative and appears stated age Head: Normocephalic, without obvious abnormality, atraumatic Neck: supple, symmetrical,  trachea midline Resp: clear to auscultation bilaterally Cardio: regular rate and rhythm GI: non tender Extremities: intact sensation and capillary refill all digits.  +epl/fpl/io.  no wounds. Pulses: 2+ and symmetric Skin: Skin color, texture, turgor normal. No rashes or lesions Neurologic: Grossly normal Incision/Wound: none  Assessment/Plan Right carpal tunnel, cubital tunnel, and volar ganglion cyst.  Non operative and operative treatment options were discussed with the patient and patient wishes to proceed with operative treatment. Risks, benefits, and alternatives of surgery were discussed and the patient agrees with the plan of care.   Erinn Mendosa R 12/20/2014, 8:36 AM

## 2014-12-20 NOTE — Op Note (Signed)
[id number] 

## 2014-12-21 ENCOUNTER — Encounter (HOSPITAL_BASED_OUTPATIENT_CLINIC_OR_DEPARTMENT_OTHER): Payer: Self-pay | Admitting: Orthopedic Surgery

## 2014-12-21 NOTE — Op Note (Signed)
NAME:  Adam Roach, Adam Roach              ACCOUNT NO.:  [id number]  MEDICAL RECORD NO.:  [id number]  LOCATION:                                 FACILITY:  PHYSICIAN:  Leanora Cover, MD             DATE OF BIRTH:  DATE OF PROCEDURE:  12/20/2014 DATE OF DISCHARGE:                              OPERATIVE REPORT   PREOPERATIVE DIAGNOSES:  Right carpal tunnel syndrome, cubital tunnel syndrome and volar wrist ganglion.  POSTOPERATIVE DIAGNOSES:  Right carpal tunnel syndrome, cubital tunnel syndrome and volar wrist ganglion.  PROCEDURE:   1. Right carpal tunnel release 2. Right cubital tunnel release with ulnar nerve transposition 3. Right wrist volar ganglion excision.  SURGEON:  Leanora Cover, MD  ASSISTANT:  Daryll Brod, MD.  ANESTHESIA:  General.  IV FLUIDS:  Per anesthesia flow sheet.  ESTIMATED BLOOD LOSS:  Minimal.  COMPLICATIONS:  None.  SPECIMENS:  None.  TOURNIQUET TIME:  85 minutes.  DISPOSITION:  Stable to PACU.  INDICATIONS:  Adam Roach is a 59 year old male, who has had pins and needle sensation in the right hand.  He has nocturnal symptoms that wake him.  He has positive nerve conduction studies.  He wished to have the carpal tunnel and cubital tunnel release.  He also noted a mass at the volar aspect of the wrist preoperatively that he would like excised. Risks, benefits, and alternatives of surgery were discussed including risk of blood loss, infection, damage to nerves, vessels, tendons, ligaments, bone; failure of surgery; need for additional surgery, complications with wound healing, continued pain, recurrence of carpal tunnel, damage to motor branch, and recurrence of mass.  He voiced understanding of these risks and elected to proceed.  OPERATIVE COURSE:  After being identified preoperatively by myself, the patient and I agreed upon procedure and site of procedure.  Surgical site was marked.  The risks, benefits, and alternatives of surgery were reviewed and  he wished to proceed.  Surgical consent had been signed. He was given IV clindamycin as preoperative antibiotic prophylaxis due to penicillin and vancomycin allergy.  He was transported to the operating room and placed on the operating room table in supine position with the right upper extremity on arm board.  General anesthesia was induced by Anesthesiology.  Right upper extremity was prepped and draped in the normal sterile orthopedic fashion.  Surgical pause was performed between surgeons, anesthesia, and operating room staff, and all were in agreement as to the patient, procedure, and site of procedure. Tourniquet at the proximal aspect of the extremity was inflated to 250 mmHg after exsanguination of the limb with an Esmarch bandage.  Incision was made over the transverse carpal ligament and carried into subcutaneous tissues by spreading technique.  Bipolar electrocautery was used to obtain hemostasis.  The palmar fascia was sharply incised. Transverse carpal ligament was identified and sharply incised.  Care was taken to ensure complete decompression distally.  It was then incised proximally.  Scissors were used to split the distal aspect of the volar antebrachial fascia.  The nerve was examined.  It was adherent to the radial leaflet.  The motor branch was identified and was intact.  The wound was copiously irrigated with sterile saline and closed with 4-0 nylon in a horizontal mattress fashion.  Attention was turned to the ganglion.  A Brunner-type incision was made at the radial side of the volar wrist and carried into subcutaneous tissues by spreading technique.  The bipolar electrocautery was used to obtain hemostasis. The mass was easily identified.  It was adherent to the radial artery. It was carefully freed up.  The mass was followed and its stalk was coming from the radial side of the radiocarpal joint.  This was removed in the past.  The mass sent to Pathology for  examination.  The suture was placed in the rent in the capsule using 4-0 Vicryl suture.  The wound was then copiously irrigated with sterile saline and closed with 4- 0 nylon in a horizontal mattress fashion.  Attention was turned to the cubital tunnel.  Incision was made at the medial side of the elbow over the ulnar nerve.  This was carried into subcutaneous tissues by spreading technique.  Bipolar electrocautery was used to obtain hemostasis.  Care was taken to preserve all neurovascular structures as possible.  The ulnar nerve was identified.  It was decompressed distally.  Great care was taken to ensure complete decompression.  The motor branches to the [facility] muscle belly were identified and preserved. The nerve was then decompressed proximally.  The finger was placed into the wounds to ensure complete decompression which was the case.  The nerve would subluxate onto the medial epicondyle.  It was then freed up and transposed anteriorly.  The intermuscular septum was released to prevent impinge point.  The portion of the Osborne's ligament that was remaining at the medial condyle was then used as a sling to prevent posterior subluxation of the nerve.  This was sutured to the anterior subcutaneous tissues.  A portion of FCU fascia was released and used as a sling as well.  This was secured with 2-0 Vicryl suture.  Care was taken to ensure that there was no compression of the nerve.  The wound was then copiously irrigated with sterile saline.  The subcutaneous tissues were closed with 2-0 Vicryl in inverted interrupted fashion. Skin was closed with 4-0 nylon in a horizontal mattress fashion.  The wounds were all injected with a total of 17 mL of 0.25% plain Marcaine to aid in postoperative analgesia.  They were then dressed with sterile Xeroform, 4x4s, and ABD at the carpal tunnel and wrapped with a Kerlix bandage.  A posterior splint was placed and wrapped with Ace bandage. The  tourniquet was deflated at 85 minutes.  Fingertips were pink with brisk capillary refill after deflation of tourniquet.  Operative drapes were broken down.  The patient was awoken from anesthesia safely.  He was transferred back to stretcher and taken to PACU in stable condition. I will see him back in the office in 1 week for postoperative followup. I will give him Percocet 5/325, 1-2 p.o. q.6 hours p.r.n. pain, dispensed #40.     Leanora Cover, MD     KK/MEDQ  D:  12/20/2014  T:  12/21/2014  Job:  [id number]

## 2015-05-31 DIAGNOSIS — I2510 Atherosclerotic heart disease of native coronary artery without angina pectoris: Secondary | ICD-10-CM | POA: Insufficient documentation

## 2015-06-01 DIAGNOSIS — I252 Old myocardial infarction: Secondary | ICD-10-CM | POA: Insufficient documentation

## 2015-06-01 DIAGNOSIS — Z955 Presence of coronary angioplasty implant and graft: Secondary | ICD-10-CM | POA: Insufficient documentation

## 2015-08-21 DIAGNOSIS — M4722 Other spondylosis with radiculopathy, cervical region: Secondary | ICD-10-CM | POA: Insufficient documentation

## 2015-08-21 DIAGNOSIS — G5621 Lesion of ulnar nerve, right upper limb: Secondary | ICD-10-CM | POA: Insufficient documentation

## 2015-09-18 ENCOUNTER — Ambulatory Visit (INDEPENDENT_AMBULATORY_CARE_PROVIDER_SITE_OTHER): Payer: BLUE CROSS/BLUE SHIELD | Admitting: Diagnostic Neuroimaging

## 2015-09-18 ENCOUNTER — Encounter: Payer: Self-pay | Admitting: Diagnostic Neuroimaging

## 2015-09-18 VITALS — BP 119/73 | HR 51 | Ht 68.0 in | Wt 177.0 lb

## 2015-09-18 DIAGNOSIS — M5010 Cervical disc disorder with radiculopathy, unspecified cervical region: Secondary | ICD-10-CM

## 2015-09-18 DIAGNOSIS — G5621 Lesion of ulnar nerve, right upper limb: Secondary | ICD-10-CM | POA: Diagnosis not present

## 2015-09-18 DIAGNOSIS — G5601 Carpal tunnel syndrome, right upper limb: Secondary | ICD-10-CM

## 2015-09-18 NOTE — Patient Instructions (Signed)
Thank you for coming to see [place] at [facility]. I hope we have been able to provide you high quality care today.  You may receive a patient satisfaction survey over the next few weeks. We would appreciate your feedback and comments so that we may continue to improve ourselves and the health of our patients.  - monitor symptoms - if weakness, muscle atrophy, right hand functioning or pain significantly worsen, then may need to consider cervical decompression surgery; otherwise, continue conservative managment   ~~~~~~~~~~~~~~~~~~~~~~~~~~~~~~~~~~~~~~~~~~~~~~~~~~~~~~~~~~~~~~~~~  DR. Eugena Rhue'S GUIDE TO HAPPY AND HEALTHY LIVING These are some of my general health and wellness recommendations. Some of them may apply to you better than others. Please use common sense as you try these suggestions and feel free to ask me any questions.   ACTIVITY/FITNESS Mental, social, emotional and physical stimulation are very important for brain and body health. Try learning a new activity (arts, music, language, sports, games).  Keep moving your body to the best of your abilities. You can do this at home, inside or outside, the park, community center, gym or anywhere you like. Consider a physical therapist or personal trainer to get started. Consider the app Sworkit. Fitness trackers such as smart-watches, smart-phones or Fitbits can help as well.   NUTRITION Eat more plants: colorful vegetables, nuts, seeds and berries.  Eat less sugar, salt, preservatives and processed foods.  Avoid toxins such as cigarettes and alcohol.  Drink water when you are thirsty. Warm water with a slice of lemon is an excellent morning drink to start the day.  Consider these websites for more information The Nutrition Source (https://www.henry-hernandez.biz/) Precision Nutrition (WindowBlog.ch)   RELAXATION Consider practicing mindfulness meditation or other  relaxation techniques such as deep breathing, prayer, yoga, tai chi, massage. See website mindful.org or the apps Headspace or Calm to help get started.   SLEEP Try to get at least 7-8+ hours sleep per day. Regular exercise and reduced caffeine will help you sleep better. Practice good sleep hygeine techniques. See website sleep.org for more information.   PLANNING Prepare estate planning, living will, healthcare POA documents. Sometimes this is best planned with the help of an attorney. Theconversationproject.org and agingwithdignity.org are excellent resources.

## 2015-09-18 NOTE — Progress Notes (Signed)
[facility]  PATIENT: Adam Roach DOB: 1956/06/01  REFERRING CLINICIAN: Richardo Priest HISTORY FROM: patient  REASON FOR VISIT: new consult    HISTORICAL  CHIEF COMPLAINT:  Chief Complaint  Patient presents with  . Cervical spondylosis w/radiculopathy    rm 7, New Patient    HISTORY OF PRESENT ILLNESS:   59 year old ambidextrous male here for evaluation of right hand and wrist pain.  February 2016 patient onset of pain and numbness in right handed wrist. He had evaluation in March 2016, diagnosed with right carpal tunnel syndrome and right cubital tunnel syndrome. He underwent surgery and decompression, with some improvement in symptoms. However the following month symptoms worsen. He went back to hand surgeon for evaluation, with repeat nerve conduction studies which showed improvement in nerve conduction numbers. Patient had evaluation of cervical spondylosis and cervical retinopathy with MRI of the cervical spine and orthopedic surgery evaluation. He was recommended to have cervical decompression surgery, but he wanted to hold off. Patient then referred to me for evaluation of symptoms.  Patient still having numbness in his right index finger. He has some muscle weakness and coordination problems with his right hand but overall significantly improved since February 2016.  At this time he is reluctant to pursue cervical spine surgical intervention.   REVIEW OF SYSTEMS: Full 14 system review of systems performed and notable only for allergies moles.  ALLERGIES: Allergies  Allergen Reactions  . Penicillins Anaphylaxis  . Vancomycin Rash and Anaphylaxis  . Ciprofloxacin In D5w Rash  . Aspirin Other (See Comments)    At full strength, stomach irritation  . Contrast Media [Iodinated Diagnostic Agents] Hives  . Ibuprofen Other (See Comments)    Stomach irritation Stomach irritation  . Ioxaglate Hives  . Ciprofloxacin Rash    HOME MEDICATIONS: Outpatient  Prescriptions Prior to Visit  Medication Sig Dispense Refill  . aspirin 81 MG tablet Take 81 mg by mouth daily.    Marland Kitchen atenolol (TENORMIN) 50 MG tablet Take 50 mg by mouth every evening.    Marland Kitchen atorvastatin (LIPITOR) 20 MG tablet Take 20 mg by mouth daily at 6 PM.    . lisinopril (PRINIVIL,ZESTRIL) 5 MG tablet Take 5 mg by mouth daily.    Marland Kitchen omeprazole (PRILOSEC) 40 MG capsule Take 40 mg by mouth every evening.    Marland Kitchen oxyCODONE-acetaminophen (PERCOCET) 5-325 MG per tablet 1-2 tabs po q6 hours prn pain 40 tablet 0   No facility-administered medications prior to visit.    PAST MEDICAL HISTORY: Past Medical History  Diagnosis Date  . Coronary artery disease     stent 2002  . Hypertension   . Hyperlipemia   . Wears glasses   . GERD (gastroesophageal reflux disease)   . Arthritis   . Cancer ([place]) 2012    rt renal  . Diabetes mellitus without complication (HCC)     Type 2  . Heart disease     PAST SURGICAL HISTORY: Past Surgical History  Procedure Laterality Date  . Partial nephrectomy  2012    right  . Ulnar nerve repair  2012    left  . Tonsillectomy    . Finger ganglion cyst excision      rt thumb  . Shoulder arthroscopy  2009    left  . Elbow arthroscopy  2010    left  . Ganglion cyst excision      lt wrist  . Cardiac catheterization  2002    stents x2  . Ulnar  nerve transposition Right 12/20/2014    Procedure: RIGHT ULNAR NERVE DECOMPRESSION, TRANSPOSITION;  Surgeon: Leanora Cover, MD;  Location: [facility];  Service: Orthopedics;  Laterality: Right;  . Carpal tunnel release Right 12/20/2014    Procedure: RIGHT CARPAL TUNNEL RELEASE;  Surgeon: Leanora Cover, MD;  Location: [facility];  Service: Orthopedics;  Laterality: Right;  . Ganglion cyst excision Right 12/20/2014    Procedure: REMOVAL GANGLION OF WRIST;  Surgeon: Leanora Cover, MD;  Location: [facility];  Service: Orthopedics;  Laterality: Right;    FAMILY HISTORY: No family  history on file.  SOCIAL HISTORY:  Social History   Social History  . Marital Status: Married    Spouse Name: Heidi  . Number of Children: 5  . Years of Education: 12   Occupational History  .      [employer]   Social History Main Topics  . Smoking status: Never Smoker   . Smokeless tobacco: Not on file  . Alcohol Use: No     Comment: last 2013  . Drug Use: No     Comment: used to use marajuana-last 2012  . Sexual Activity: Not on file   Other Topics Concern  . Not on file   Social History Narrative   Lives with wife who has stage 4 lung cancer   Caffeine use- coffee- 2-3 cups daily     PHYSICAL EXAM  GENERAL EXAM/CONSTITUTIONAL: Vitals:  Filed Vitals:   09/18/15 0857  BP: 119/73  Pulse: 51  Height: '5\' 8"'$  (1.727 m)  Weight: 177 lb (80.287 kg)     Body mass index is 26.92 kg/(m^2).  Visual Acuity Screening   Right eye Left eye Both eyes  Without correction:     With correction: 20/30 20/30      Patient is in no distress; well developed, nourished and groomed; neck is supple  CARDIOVASCULAR:  Examination of carotid arteries is normal; no carotid bruits  Regular rate and rhythm, no murmurs  Examination of peripheral vascular system by observation and palpation is normal  EYES:  Ophthalmoscopic exam of optic discs and posterior segments is normal; no papilledema or hemorrhages  MUSCULOSKELETAL:  Gait, strength, tone, movements noted in Neurologic exam below  NEUROLOGIC: MENTAL STATUS:  No flowsheet data found.  awake, alert, oriented to person, place and time  recent and remote memory intact  normal attention and concentration  language fluent, comprehension intact, naming intact,   fund of knowledge appropriate  CRANIAL NERVE:   2nd - no papilledema on fundoscopic exam  2nd, 3rd, 4th, 6th - pupils equal and reactive to light, visual fields full to confrontation, extraocular muscles intact, no nystagmus  5th - facial  sensation symmetric  7th - facial strength symmetric  8th - hearing intact  9th - palate elevates symmetrically, uvula midline  11th - shoulder shrug symmetric  12th - tongue protrusion midline  MOTOR:   normal bulk and tone, EXCEPT SLIGHT ATROPHY OF RIGHT FIRST DORSAL INTEROSSEOUS; MILD ATROPHY OF OTHER RIGHT HAND INTRINSIC MUSCLES; OTHERWISE full strength in the BUE, BLE  SENSORY:   normal and symmetric to light touch, pinprick, temperature, vibration  COORDINATION:   finger-nose-finger, fine finger movements, heel-shin normal  REFLEXES:   deep tendon reflexes TRACE and symmetric  GAIT/STATION:   narrow based gait; SLIGHT DIFF WITH TANDEM; romberg is negative    DIAGNOSTIC DATA (LABS, IMAGING, TESTING) - I reviewed patient records, labs, notes, testing and imaging myself where available.  Lab  Results  Component Value Date   HGB 14.6 12/20/2014   HCT 43.0 12/20/2014      Component Value Date/Time   NA 137 12/20/2014 0741   K 4.0 12/20/2014 0741   CL 101 12/20/2014 0741   CO2 29 12/31/2010 1330   GLUCOSE 235* 12/20/2014 0741   BUN 10 12/20/2014 0741   CREATININE 0.60 12/20/2014 0741   CALCIUM 9.0 12/31/2010 1330   GFRNONAA >60 12/31/2010 1330   GFRAA  12/31/2010 1330    >60        The eGFR has been calculated using the MDRD equation. This calculation has not been validated in all clinical situations. eGFR's persistently <60 mL/min signify possible Chronic Kidney Disease.   No results found for: CHOL, HDL, LDLCALC, LDLDIRECT, TRIG, CHOLHDL No results found for: HGBA1C No results found for: VITAMINB12 No results found for: TSH    07/01/15 MRI cervical spine [I reviewed images myself. I included my interpretation below. Multi-level degenerative changes. -VRP]  - At C5-6, C6-7: severe biforaminal stenosis and mild spinal stenosis due to uncovertebral joint hypertrophy and disc bulging and facet hypertrophy  - At C3-4: severe left foraminal  stenosis - At C2-3: moderate biforaminal stenosis     ASSESSMENT AND PLAN  59 y.o. year old male here with right hand numbness, pain, weakness with right carpal tunnel syndrome and right cubital tunnel syndrome, status post decompression with improvement in symptoms. Patient also with concomitant cervical radiculopathy with severe by foraminal stenosis at C5-6 and C6-7. 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 At this time symptoms have stabilized and patient is reluctant to pursue cervical spine surgery.   Dx:  Cervical disc disorder with radiculopathy of cervical region  Carpal tunnel syndrome of right wrist  Cubital tunnel syndrome, right    PLAN: - monitor symptoms - if weakness, atrophy, right hand function or pain significantly worsen, then may need to consider cervical decompression surgery; otherwise, continue conservative managment  Return if symptoms worsen or fail to improve, for return to PCP and orthopedic surgery.    Penni Bombard, MD 98/47/3085, 6:94 AM Certified in Neurology, Neurophysiology and Neuroimaging  [facility] [address] [place], [place] [address] [phone]

## 2016-02-07 DIAGNOSIS — M25511 Pain in right shoulder: Secondary | ICD-10-CM | POA: Insufficient documentation

## 2016-02-07 DIAGNOSIS — K219 Gastro-esophageal reflux disease without esophagitis: Secondary | ICD-10-CM | POA: Insufficient documentation

## 2017-06-03 DIAGNOSIS — N529 Male erectile dysfunction, unspecified: Secondary | ICD-10-CM | POA: Insufficient documentation

## 2017-09-19 DIAGNOSIS — E118 Type 2 diabetes mellitus with unspecified complications: Secondary | ICD-10-CM | POA: Insufficient documentation

## 2018-03-26 DIAGNOSIS — Z85528 Personal history of other malignant neoplasm of kidney: Secondary | ICD-10-CM | POA: Insufficient documentation

## 2019-09-14 DIAGNOSIS — I4819 Other persistent atrial fibrillation: Secondary | ICD-10-CM | POA: Insufficient documentation

## 2019-10-28 DIAGNOSIS — R195 Other fecal abnormalities: Secondary | ICD-10-CM | POA: Insufficient documentation

## 2019-12-30 DIAGNOSIS — M5137 Other intervertebral disc degeneration, lumbosacral region: Secondary | ICD-10-CM | POA: Insufficient documentation

## 2019-12-30 DIAGNOSIS — M48062 Spinal stenosis, lumbar region with neurogenic claudication: Secondary | ICD-10-CM | POA: Insufficient documentation

## 2019-12-30 DIAGNOSIS — M5416 Radiculopathy, lumbar region: Secondary | ICD-10-CM | POA: Insufficient documentation

## 2019-12-30 DIAGNOSIS — M51379 Other intervertebral disc degeneration, lumbosacral region without mention of lumbar back pain or lower extremity pain: Secondary | ICD-10-CM | POA: Insufficient documentation

## 2019-12-30 DIAGNOSIS — M47816 Spondylosis without myelopathy or radiculopathy, lumbar region: Secondary | ICD-10-CM | POA: Insufficient documentation

## 2020-09-14 DIAGNOSIS — Z8679 Personal history of other diseases of the circulatory system: Secondary | ICD-10-CM | POA: Insufficient documentation

## 2020-09-14 DIAGNOSIS — Z9889 Other specified postprocedural states: Secondary | ICD-10-CM | POA: Insufficient documentation

## 2020-11-30 DIAGNOSIS — R29898 Other symptoms and signs involving the musculoskeletal system: Secondary | ICD-10-CM | POA: Insufficient documentation

## 2021-03-05 DIAGNOSIS — Z7901 Long term (current) use of anticoagulants: Secondary | ICD-10-CM | POA: Insufficient documentation

## 2022-04-10 DIAGNOSIS — Z9889 Other specified postprocedural states: Secondary | ICD-10-CM | POA: Insufficient documentation

## 2022-07-19 ENCOUNTER — Other Ambulatory Visit: Payer: Self-pay | Admitting: Neurological Surgery

## 2022-08-05 ENCOUNTER — Other Ambulatory Visit: Payer: Self-pay | Admitting: Neurological Surgery

## 2022-08-06 NOTE — Pre-Procedure Instructions (Signed)
Surgical Instructions    Your procedure is scheduled on Friday, August 16, 2022 at 7:30 AM.  Report to [facility] Main Entrance "A" at 5:30 A.M., then check in with the Admitting office.  Call this number if you have problems the morning of surgery:  [phone]   If you have any questions prior to your surgery date call [phone]: Open Monday-Friday 8am-4pm  *If you experience any cold or flu symptoms such as cough, fever, chills, shortness of breath, etc. between now and your scheduled surgery, please notify us.*    Remember:  Do not eat or drink after midnight the night before your surgery    Take these medicines the morning of surgery with A SIP OF WATER:  nitroGLYCERIN (NITROSTAT) - if needed  Follow your surgeon's instructions on when to stop Aspirin.  If no instructions were given by your surgeon then you will need to call the office to get those instructions.     As of today, STOP taking any Aleve, Naproxen, Ibuprofen, Motrin, Advil, Goody's, BC's, all herbal medications, fish oil, and all vitamins.                     Do NOT Smoke (Tobacco/Vaping) for 24 hours prior to your procedure.  If you use a CPAP at night, you may bring your mask/headgear for your overnight stay.   Contacts, glasses, piercing's, hearing aid's, dentures or partials may not be worn into surgery, please bring cases for these belongings.    For patients admitted to the hospital, discharge time will be determined by your treatment team.   Patients discharged the day of surgery will not be allowed to drive home, and someone needs to stay with them for 24 hours.  SURGICAL WAITING ROOM VISITATION Patients having surgery or a procedure may have two support people in the waiting area. Visitors may stay in the waiting area during the procedure and switch out with other visitors if needed. Children under the age of 14 must have an adult accompany them who is not the patient. If the patient needs to  stay at the hospital during part of their recovery, the visitor guidelines for inpatient rooms apply.  Please refer to the [facility] website for the visitor guidelines for Inpatients (after your surgery is over and you are in a regular room).    Special instructions:   [place]- Preparing For Surgery  Before surgery, you can play an important role. Because skin is not sterile, your skin needs to be as free of germs as possible. You can reduce the number of germs on your skin by washing with CHG (chlorahexidine gluconate) Soap before surgery.  CHG is an antiseptic cleaner which kills germs and bonds with the skin to continue killing germs even after washing.    Oral Hygiene is also important to reduce your risk of infection.  Remember - BRUSH YOUR TEETH THE MORNING OF SURGERY WITH YOUR REGULAR TOOTHPASTE  Please do not use if you have an allergy to CHG or antibacterial soaps. If your skin becomes reddened/irritated stop using the CHG.  Do not shave (including legs and underarms) for at least 48 hours prior to first CHG shower. It is OK to shave your face.  Please follow these instructions carefully.   Shower the NIGHT BEFORE SURGERY and the MORNING OF SURGERY  If you chose to wash your hair, wash your hair first as usual with your normal shampoo.  After you shampoo, rinse your hair  and body thoroughly to remove the shampoo.  Use CHG Soap as you would any other liquid soap. You can apply CHG directly to the skin and wash gently with a scrungie or a clean washcloth.   Apply the CHG Soap to your body ONLY FROM THE NECK DOWN.  Do not use on open wounds or open sores. Avoid contact with your eyes, ears, mouth and genitals (private parts). Wash Face and genitals (private parts)  with your normal soap.   Wash thoroughly, paying special attention to the area where your surgery will be performed.  Thoroughly rinse your body with warm water from the neck down.  DO NOT shower/wash with your  normal soap after using and rinsing off the CHG Soap.  Pat yourself dry with a CLEAN TOWEL.  Wear CLEAN PAJAMAS to bed the night before surgery  Place CLEAN SHEETS on your bed the night before your surgery  DO NOT SLEEP WITH PETS.   Day of Surgery: Take a shower with CHG soap. Do not wear jewelry or makeup Do not wear lotions, powders, perfumes/colognes, or deodorant. Men may shave face and neck. Do not bring valuables to the hospital.  [facility] is not responsible for any belongings or valuables. Wear Clean/Comfortable clothing the morning of surgery Do not apply any deodorants/lotions.   Remember to brush your teeth WITH YOUR REGULAR TOOTHPASTE.   Please read over the following fact sheets that you were given.  If you received a COVID test during your pre-op visit  it is requested that you wear a mask when out in public, stay away from anyone that may not be feeling well and notify your surgeon if you develop symptoms. If you have been in contact with anyone that has tested positive in the last 10 days please notify you surgeon.

## 2022-08-07 ENCOUNTER — Encounter (HOSPITAL_COMMUNITY)
Admission: RE | Admit: 2022-08-07 | Discharge: 2022-08-07 | Disposition: A | Discharge status: Home / Self Care | Payer: Medicare Other | Source: Ambulatory Visit | Attending: Neurological Surgery | Admitting: Neurological Surgery

## 2022-08-07 ENCOUNTER — Encounter (HOSPITAL_COMMUNITY): Payer: Self-pay

## 2022-08-07 ENCOUNTER — Other Ambulatory Visit: Payer: Self-pay

## 2022-08-07 VITALS — BP 141/70 | HR 58 | Temp 97.8°F | Resp 17 | Ht 65.5 in | Wt 163.5 lb

## 2022-08-07 DIAGNOSIS — I2510 Atherosclerotic heart disease of native coronary artery without angina pectoris: Secondary | ICD-10-CM | POA: Insufficient documentation

## 2022-08-07 DIAGNOSIS — E119 Type 2 diabetes mellitus without complications: Secondary | ICD-10-CM | POA: Insufficient documentation

## 2022-08-07 DIAGNOSIS — Z01812 Encounter for preprocedural laboratory examination: Secondary | ICD-10-CM | POA: Insufficient documentation

## 2022-08-07 DIAGNOSIS — Z01818 Encounter for other preprocedural examination: Secondary | ICD-10-CM

## 2022-08-07 HISTORY — DX: Cardiac arrhythmia, unspecified: I49.9

## 2022-08-07 LAB — CBC
HCT: 39.7 % (ref 39.0–52.0)
Hemoglobin: 13.3 g/dL (ref 13.0–17.0)
MCH: 29.9 pg (ref 26.0–34.0)
MCHC: 33.5 g/dL (ref 30.0–36.0)
MCV: 89.2 fL (ref 80.0–100.0)
Platelets: 198 10*3/uL (ref 150–400)
RBC: 4.45 MIL/uL (ref 4.22–5.81)
RDW: 12.9 % (ref 11.5–15.5)
WBC: 6.4 10*3/uL (ref 4.0–10.5)
nRBC: 0 % (ref 0.0–0.2)

## 2022-08-07 LAB — PROTIME-INR
INR: 1 (ref 0.8–1.2)
Prothrombin Time: 13.1 seconds (ref 11.4–15.2)

## 2022-08-07 LAB — GLUCOSE, CAPILLARY: Glucose-Capillary: 142 mg/dL — ABNORMAL HIGH (ref 70–99)

## 2022-08-07 LAB — BASIC METABOLIC PANEL
Anion gap: 6 (ref 5–15)
BUN: 14 mg/dL (ref 8–23)
CO2: 27 mmol/L (ref 22–32)
Calcium: 9.4 mg/dL (ref 8.9–10.3)
Chloride: 103 mmol/L (ref 98–111)
Creatinine, Ser: 0.77 mg/dL (ref 0.61–1.24)
GFR, Estimated: >60 mL/min (ref >60)
Glucose, Bld: 146 mg/dL — ABNORMAL HIGH (ref 70–99)
Potassium: 5 mmol/L (ref 3.5–5.1)
Sodium: 136 mmol/L (ref 135–145)

## 2022-08-07 LAB — TYPE AND SCREEN
ABO/RH(D): O POS
Antibody Screen: NEGATIVE

## 2022-08-07 LAB — HEMOGLOBIN A1C
Hgb A1c MFr Bld: 7 % — ABNORMAL HIGH (ref 4.8–5.6)
Mean Plasma Glucose: 154.2 mg/dL

## 2022-08-07 LAB — SURGICAL PCR SCREEN
MRSA, PCR: NEGATIVE
Staphylococcus aureus: NEGATIVE

## 2022-08-07 NOTE — Pre-Procedure Instructions (Signed)
Surgical Instructions    Your procedure is scheduled on Friday, August 16, 2022 at 7:30 AM.  Report to [facility] Main Entrance "A" at 5:30 A.M., then check in with the Admitting office.  Call this number if you have problems the morning of surgery:  [phone]   If you have any questions prior to your surgery date call [phone]: Open Monday-Friday 8am-4pm  *If you experience any cold or flu symptoms such as cough, fever, chills, shortness of breath, etc. between now and your scheduled surgery, please notify us.*    Remember:  Do not eat or drink after midnight the night before your surgery    Take these medicines the morning of surgery with A SIP OF WATER: atorvastatin (LIPITOR)  omeprazole (PRILOSEC)  ranolazine (RANEXA)   IF NEEDED: nitroGLYCERIN (NITROSTAT) - if needed HYDROcodone-acetaminophen (NORCO/VICODIN)  tiZANidine (ZANAFLEX)   Follow your surgeon's instructions on when to stop Aspirin.  If no instructions were given by your surgeon then you will need to call the office to get those instructions.    As of today, STOP taking any Aleve, Naproxen, Ibuprofen, Motrin, Advil, Goody's, BC's, all herbal medications, fish oil, and all vitamins.        WHAT DO I DO ABOUT MY DIABETES MEDICATION?   Do not take oral diabetes medicines (pills) the morning of surgery.  THE NIGHT BEFORE SURGERY, take 8 units of LEVEMIR.      The day of surgery, do not take other diabetes injectables, including Byetta (exenatide), Bydureon (exenatide ER), Victoza (liraglutide), or Trulicity (dulaglutide).  If your CBG is greater than 220 mg/dL, you may take  of your sliding scale (correction) dose of insulin.   HOW TO MANAGE YOUR DIABETES BEFORE AND AFTER SURGERY  Why is it important to control my blood sugar before and after surgery? Improving blood sugar levels before and after surgery helps healing and can limit problems. A way of improving blood sugar control is eating a  healthy diet by:  Eating less sugar and carbohydrates  Increasing activity/exercise  Talking with your doctor about reaching your blood sugar goals High blood sugars (greater than 180 mg/dL) can raise your risk of infections and slow your recovery, so you will need to focus on controlling your diabetes during the weeks before surgery. Make sure that the doctor who takes care of your diabetes knows about your planned surgery including the date and location.  How do I manage my blood sugar before surgery? Check your blood sugar at least 4 times a day, starting 2 days before surgery, to make sure that the level is not too high or low.  Check your blood sugar the morning of your surgery when you wake up and every 2 hours until you get to the Short Stay unit.  If your blood sugar is less than 70 mg/dL, you will need to treat for low blood sugar: Do not take insulin. Treat a low blood sugar (less than 70 mg/dL) with  cup of clear juice (cranberry or apple), 4 glucose tablets, OR glucose gel. Recheck blood sugar in 15 minutes after treatment (to make sure it is greater than 70 mg/dL). If your blood sugar is not greater than 70 mg/dL on recheck, call [phone] for further instructions. Report your blood sugar to the short stay nurse when you get to Short Stay.  If you are admitted to the hospital after surgery: Your blood sugar will be checked by the staff and you will probably be given insulin  after surgery (instead of oral diabetes medicines) to make sure you have good blood sugar levels. The goal for blood sugar control after surgery is 80-180 mg/dL.               Do NOT Smoke (Tobacco/Vaping) for 24 hours prior to your procedure.  If you use a CPAP at night, you may bring your mask/headgear for your overnight stay.   Contacts, glasses, piercing's, hearing aid's, dentures or partials may not be worn into surgery, please bring cases for these belongings.    For patients admitted to the  hospital, discharge time will be determined by your treatment team.   Patients discharged the day of surgery will not be allowed to drive home, and someone needs to stay with them for 24 hours.  SURGICAL WAITING ROOM VISITATION Patients having surgery or a procedure may have two support people in the waiting area. Visitors may stay in the waiting area during the procedure and switch out with other visitors if needed. Children under the age of 67 must have an adult accompany them who is not the patient. If the patient needs to stay at the hospital during part of their recovery, the visitor guidelines for inpatient rooms apply.  Please refer to the [facility] website for the visitor guidelines for Inpatients (after your surgery is over and you are in a regular room).    Special instructions:   [facility]- Preparing For Surgery  Before surgery, you can play an important role. Because skin is not sterile, your skin needs to be as free of germs as possible. You can reduce the number of germs on your skin by washing with CHG (chlorahexidine gluconate) Soap before surgery.  CHG is an antiseptic cleaner which kills germs and bonds with the skin to continue killing germs even after washing.    Oral Hygiene is also important to reduce your risk of infection.  Remember - BRUSH YOUR TEETH THE MORNING OF SURGERY WITH YOUR REGULAR TOOTHPASTE  Please do not use if you have an allergy to CHG or antibacterial soaps. If your skin becomes reddened/irritated stop using the CHG.  Do not shave (including legs and underarms) for at least 48 hours prior to first CHG shower. It is OK to shave your face.  Please follow these instructions carefully.   Shower the NIGHT BEFORE SURGERY and the MORNING OF SURGERY  If you chose to wash your hair, wash your hair first as usual with your normal shampoo.  After you shampoo, rinse your hair and body thoroughly to remove the shampoo.  Use CHG Soap as you would any other  liquid soap. You can apply CHG directly to the skin and wash gently with a scrungie or a clean washcloth.   Apply the CHG Soap to your body ONLY FROM THE NECK DOWN.  Do not use on open wounds or open sores. Avoid contact with your eyes, ears, mouth and genitals (private parts). Wash Face and genitals (private parts)  with your normal soap.   Wash thoroughly, paying special attention to the area where your surgery will be performed.  Thoroughly rinse your body with warm water from the neck down.  DO NOT shower/wash with your normal soap after using and rinsing off the CHG Soap.  Pat yourself dry with a CLEAN TOWEL.  Wear CLEAN PAJAMAS to bed the night before surgery  Place CLEAN SHEETS on your bed the night before your surgery  DO NOT SLEEP WITH PETS.   Day of  Surgery: Take a shower with CHG soap. Do not wear jewelry or makeup Do not wear lotions, powders, perfumes/colognes, or deodorant. Men may shave face and neck. Do not bring valuables to the hospital.  [facility] is not responsible for any belongings or valuables. Wear Clean/Comfortable clothing the morning of surgery Do not apply any deodorants/lotions.   Remember to brush your teeth WITH YOUR REGULAR TOOTHPASTE.   Please read over the following fact sheets that you were given.  If you received a COVID test during your pre-op visit  it is requested that you wear a mask when out in public, stay away from anyone that may not be feeling well and notify your surgeon if you develop symptoms. If you have been in contact with anyone that has tested positive in the last 10 days please notify you surgeon.

## 2022-08-07 NOTE — Progress Notes (Signed)
PCP - Beverely Low Cardiologist - Denny Levy at [facility] ([facility])  PPM/ICD -  Device Orders -  Rep Notified -   Chest x-ray - n/a EKG - 11/29/21-[facility]- tracing requested Stress Test - 12/17/21 ECHO - 09/13/20 Cardiac Cath - 09/30/00  Sleep Study - denies   Fasting Blood Sugar - 140s Does not check CBG    ow your surgeon's instructions on when to stop Aspirin.  If no instructions were given by your surgeon then you will need to call the office to get those instructions.       As of today, STOP taking any Aleve, Naproxen, Ibuprofen, Motrin, Advil, Goody's, BC's, all herbal medications, fish oil, and all vitamins.  ERAS Protcol -NPO   COVID TEST- not needed   Anesthesia review: yes, cardiac history  Patient denies shortness of breath, fever, cough and chest pain at PAT appointment   All instructions explained to the patient, with a verbal understanding of the material. Patient agrees to go over the instructions while at home for a better understanding. Patient also instructed to self quarantine after being tested for COVID-19. The opportunity to ask questions was provided.

## 2022-08-09 ENCOUNTER — Encounter (HOSPITAL_COMMUNITY): Payer: Self-pay

## 2022-08-09 NOTE — Anesthesia Preprocedure Evaluation (Addendum)
Anesthesia Evaluation  Patient identified by MRN, date of birth, ID band Patient awake    Reviewed: Allergy & Precautions, NPO status , Patient's Chart, lab work & pertinent test results  Airway Mallampati: II  TM Distance: >3 FB Neck ROM: Full    Dental  (+) Dental Advisory Given   Pulmonary neg pulmonary ROS   breath sounds clear to auscultation       Cardiovascular hypertension, Pt. on medications and Pt. on home beta blockers + Past MI and + Cardiac Stents  + dysrhythmias  Rhythm:Regular Rate:Normal  Nuclear stress test 12/17/21 ([facility]): IMPRESSION:  1. Fixed inferior wall and inferior septal wall near the base to the mid  inferior wall.  The rest of the study is normal there is no evidence of  reversible ischemia.  2.  Calculated ejection fraction is 52%  3.  Totally fixed inferior and inferior septal wall defect without  reversible ischemia with an EF of 52%.    Echo 08/09/21 ([facility]): SUMMARY  There is mild concentric left ventricular hypertrophy.  Global LV systolic function is preserved  Small area of basal inferior thinning and akinesis  LV ejection fraction = 60-65%.  There is aortic valve sclerosis.  There is no aortic stenosis.     Neuro/Psych negative neurological ROS     GI/Hepatic Neg liver ROS,GERD  Medicated,,  Endo/Other  diabetes, Type 2, Insulin Dependent    Renal/GU Renal disease     Musculoskeletal  (+) Arthritis ,    Abdominal   Peds  Hematology negative hematology ROS (+)   Anesthesia Other Findings   Reproductive/Obstetrics                              Lab Results  Component Value Date   WBC 6.4 08/07/2022   HGB 13.3 08/07/2022   HCT 39.7 08/07/2022   MCV 89.2 08/07/2022   PLT 198 08/07/2022   Lab Results  Component Value Date   CREATININE 0.77 08/07/2022   BUN 14 08/07/2022   NA 136 08/07/2022   K 5.0 08/07/2022   CL 103 08/07/2022    CO2 27 08/07/2022    Anesthesia Physical Anesthesia Plan  ASA: 3  Anesthesia Plan: General   Post-op Pain Management: Tylenol PO (pre-op)*, Ketamine IV* and Dilaudid IV   Induction: Intravenous  PONV Risk Score and Plan: 2 and Dexamethasone, Ondansetron, Midazolam and Treatment may vary due to age or medical condition  Airway Management Planned: Oral ETT  Additional Equipment:   Intra-op Plan:   Post-operative Plan: Extubation in OR  Informed Consent: I have reviewed the patients History and Physical, chart, labs and discussed the procedure including the risks, benefits and alternatives for the proposed anesthesia with the patient or authorized representative who has indicated his/her understanding and acceptance.     Dental advisory given  Plan Discussed with:   Anesthesia Plan Comments: (  )        Anesthesia Quick Evaluation

## 2022-08-09 NOTE — Progress Notes (Signed)
Anesthesia Chart Review:  Case: [id number] Date/Time: 08/16/22 0715   Procedures:      PLIF - L2-L3 - L3-L4 - L4-L5 - L5-S1  with O-Arm (Back)     Application of O-Arm   Anesthesia type: General   Pre-op diagnosis: Spondylolisthesis   Location: [facility] OR ROOM 20 / [facility]   Surgeons: Eustace Moore, MD       DISCUSSION: Patient is a 66 year old male scheduled for the above procedure.  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 Otho Perl was on 07/16/22 routine 43-monthfollow-up and preoperative cardiovascular exam. He was maintaining SR following afib ablation in 08/2020.  He had been off anticoagulation for a few months.  No anginal symptoms.  He wrote, "From a cardiac standpoint he appears to be at acceptable risk to proceed on with the back surgery."  08/07/2022 labs acceptable for OR. 11[facility]29/21 [facility] but if not received will need updated EKG on arrival.  Anesthesia team to evaluate on the day of surgery.   VS: BP (!) 141/70   Pulse (!) 58   Temp 36.6 C   Resp 17   Ht 5' 5.5" (1.664 m)   Wt 74.2 kg   SpO2 98%   BMI 26.79 kg/m    PROVIDERS: WBurman Freestone MD is PCP  FAbran Richard MD is cardiologist FAdrian Prows MD is EP cardiologist   LABS: Labs reviewed: Acceptable for surgery. (all labs ordered are listed, but only abnormal results are displayed)  Labs Reviewed  GLUCOSE, CAPILLARY - Abnormal; Notable for the following components:      Result Value   Glucose-Capillary 142 (*)    All other components within normal limits  BASIC METABOLIC PANEL - Abnormal; Notable for the following components:   Glucose, Bld 146 (*)    All other components within normal limits   HEMOGLOBIN A1C - Abnormal; Notable for the following components:   Hgb A1c MFr Bld 7.0 (*)    All other components within normal limits  SURGICAL PCR SCREEN  CBC  PROTIME-INR  TYPE AND SCREEN     IMAGES: MRI L-spine 04/03/22 ([facility]): IMPRESSION:  1. Advanced and generalized lumbar spine degeneration with  scoliosis.  2. L2-3 and L3-4 advanced thecal sac stenosis.  3. L4-5 improved thecal sac patency from interval decompression.  4. Foraminal impingement primarily on the right at L3-4, L4-5 and on  the left at L5-S1.  5. Notable interval marrow edema at the right L3-4 facet.    EKG: EKG 11/29/21 ([facility]): Requested. Per Result Narrative in [facility]: Sinus rhythm  Right bundle branch block  Confirmed by CGabrielle Dare([phone] on 11/30/2021 7:59:37 AM    CV: Nuclear stress test 12/17/21 ([facility]): IMPRESSION:  1. Fixed inferior wall and inferior septal wall near the base to the mid  inferior wall.  The rest of the study is normal there is no evidence of  reversible ischemia.  2.  Calculated ejection fraction is 52%  3.  Totally fixed inferior and inferior septal wall defect without  reversible ischemia with an EF of 52%.    Echo 08/09/21 ([facility]): SUMMARY  There is mild concentric left ventricular hypertrophy.  Global LV systolic function is preserved  Small area of basal  inferior thinning and akinesis  LV ejection fraction = 60-65%.  There is aortic valve sclerosis.  There is no aortic stenosis.  Compared to the last study dated 05/2020 LV function improved  - Comparison TEE 09/13/20 LVEF 35-40% while in rapid afib, basal inferior and inferoseptal akinesis consistent with prior MI, hypokinesis in remaining segments   > 48 hour - 7 day Cardiac monitor 03/05/21 ([facility]): Results as outlined by EP Dr. Adrian Prows in Care Everywhere: "2% burden of atrial arrhythmia and some short runs of atrial tachycardia but no atrial fibrillation."    EP Procedure 09/14/20 ([facility]): Electrophysiology study with transeptal Catherization using Intracardiac  Ultrasound, 3D mapping of Left Atrium and Pulmonary Veins pre and post  ablation, Pulmonary Vein Isolation using the Cryoballoon for Atrial  Fibrillation, Radiofrequency Ablation in the left atrium to complete  pulmonary vein isolation.    CT Cardiac 09/13/20 (CT evaluation of LA & pulmonary vein anatomy prior to endocardial ablation of atrial fibrillation; [facility]): Summary:  1. The left atrium is moderately dilated with an AP diameter 51.5 mm.  2. There are 2 left sided pulmonary veins and 2 right-sided pulmonary  veins.  a. Left inferior pulmonary vein 19.3 x 16.0 mm  b. Left superior pulmonary vein 21.7 x 14.3 mm  c. Right inferior pulmonary vein 22.0 x 13.2 mm  d. Right superior pulmonary vein 18.7 x 16.7 mm  3. There is no evidence of stenosis of the pulmonary vein connections.  4. The left atrial appendage is medium sized.  The orifice of the left  atrial appendage measures 20.7 x 12.7 mm.  There was no evidence of  thrombus in the left atrial appendage.  5. There is a small accessory left atrial appendage located in the right,  superior, anterior appendage.  It is free from thrombus..  6. The heart is morphologically normal with no evidence of congenital  abnormalities.  7. Normal-appearing pericardium with no pericardial effusion     TEE 09/13/20 ([facility], pre-ablation): SUMMARY  Normal left ventricular size and wall thickness with basal inferior  and inferoseptal akinesis consitent with prior transmural MI, and  hypokinesis of remaining LV segments.  Left ventricular systolic function is moderately reduced.  LV ejection fraction = 35-40% after sedation and whilst in rapidly  conducted atrial fibrillation.  The right ventricular systolic function is mildly reduced.  The left atrium is mildly to moderately dilated. No thrombus is  detected in the left atrial appendage.  The right atrium is  mildly dilated.  Small attatched Lamble's excrescence on aortic valve.  There is mild tricuspid regurgitation.  There was insufficient TR detected to calculate RV systolic pressure.    Cardiac cath 08/19/19 ([facility]): Angiographic Findings   Cardiac Arteries and Lesion Findings  LMCA: 0% and Normal.  LAD: Single stenosis.    Lesion on Prox LAD: Ostial.40% stenosis 5 mm length . Pre procedure TIMI    III flow was noted. Good run off was present. The lesion was diagnosed as    Moderate Risk (B). Bifurcation lesion.    Comments:normal iFR  LCx: Normal.  RCA: Abnormal and Single stenosis.    Lesion on Prox RCA: Mid subsection.70% stenosis. Pre procedure TIMI III    flow was noted. Good run off was present. The lesion was diagnosed as Low    Risk (A).    Comments:ISR    Normal iFR  Ramus: Abnormal and Single stenosis.    Lesion on Ramus: Ostial.85% stenosis 10 mm  length reduced to 0%. Pre    procedure TIMI III flow was noted. Post Procedure TIMI III flow was    present. Good run off was present. The lesion was diagnosed as Moderate    Risk (B).   Diagnostic Summary  1. Multivessel CAD. LAD and RCA lesions with angiographic intermediate lesions, with normal iFR abnormal LV function, inferior basal akinesis  Diagnostic Recommendations  1. PCI of LAD and RCA deferred since normal Pd/Pa both sites.  Interventional Summary  1. Successful PCI / Resolute Onyx Drug Eluting Stent of the ostial Ramus Coronary Artery. DAPT x 6 months    Past Medical History:  Diagnosis Date   Arthritis    Cancer ([place]) 2012   Right clear cell renal cancer, s/p right partial nephrectomy 08/17/11   Coronary artery disease    inferior MI, s/p RCA stent 2002; DES ostimal ramus 08/19/19   Diabetes mellitus without complication ([place])    Type 2   Dysrhythmia    afib, s/p ablation 09/14/20, no known recurrence (08/08/22)   GERD (gastroesophageal reflux disease)    Hyperlipemia    Hypertension    Myocardial  infarction [facility]) 2002   RCA stent [facility])   Wears glasses     Past Surgical History:  Procedure Laterality Date   CARDIAC CATHETERIZATION     RCA stent 2002; DES ostial ramus 08/19/19   CARPAL TUNNEL RELEASE Right 12/20/2014   Procedure: RIGHT CARPAL TUNNEL RELEASE;  Surgeon: Leanora Cover, MD;  Location: [place];  Service: Orthopedics;  Laterality: Right;   ELBOW ARTHROSCOPY  09/30/2008   left   FINGER GANGLION CYST EXCISION     rt thumb   GANGLION CYST EXCISION     lt wrist   GANGLION CYST EXCISION Right 12/20/2014   Procedure: REMOVAL GANGLION OF WRIST;  Surgeon: Leanora Cover, MD;  Location: [place];  Service: Orthopedics;  Laterality: Right;   PARTIAL NEPHRECTOMY  08/27/2011   right   SHOULDER ARTHROSCOPY  10/01/2007   left   TONSILLECTOMY     ULNAR NERVE REPAIR  09/30/2010   left   ULNAR NERVE TRANSPOSITION Right 12/20/2014   Procedure: RIGHT ULNAR NERVE DECOMPRESSION, TRANSPOSITION;  Surgeon: Leanora Cover, MD;  Location: [place];  Service: Orthopedics;  Laterality: Right;    MEDICATIONS:  aspirin 81 MG tablet   atenolol (TENORMIN) 50 MG tablet   atorvastatin (LIPITOR) 80 MG tablet   HYDROcodone-acetaminophen (NORCO/VICODIN) 5-325 MG tablet   LEVEMIR FLEXPEN 100 UNIT/ML FlexPen   lisinopril (ZESTRIL) 10 MG tablet   METAMUCIL FIBER PO   Multiple Vitamins-Minerals (PRESERVISION AREDS 2 PO)   nitroGLYCERIN (NITROSTAT) 0.4 MG SL tablet   omeprazole (PRILOSEC) 40 MG capsule   ranolazine (RANEXA) 500 MG 12 hr tablet   sildenafil (REVATIO) 20 MG tablet   tiZANidine (ZANAFLEX) 4 MG tablet   traZODone (DESYREL) 50 MG tablet   No current facility-administered medications for this encounter.  Advised to clarify perioperative aspirin instructions with surgeon.   Myra Gianotti, PA-C Surgical Short Stay/Anesthesiology [facility] Phone [phone] [facility] Phone [phone] 08/09/2022 6:03 PM

## 2022-08-16 ENCOUNTER — Encounter (HOSPITAL_COMMUNITY)
Admission: RE | Disposition: A | Discharge status: Home / Self Care | Payer: Self-pay | Source: Home / Self Care | Attending: Neurological Surgery

## 2022-08-16 ENCOUNTER — Inpatient Hospital Stay (HOSPITAL_COMMUNITY): Payer: Medicare Other

## 2022-08-16 ENCOUNTER — Other Ambulatory Visit: Payer: Self-pay

## 2022-08-16 ENCOUNTER — Inpatient Hospital Stay (HOSPITAL_COMMUNITY): Payer: Medicare Other | Admitting: Vascular Surgery

## 2022-08-16 ENCOUNTER — Inpatient Hospital Stay (HOSPITAL_COMMUNITY)
Admission: RE | Admit: 2022-08-16 | Discharge: 2022-08-17 | DRG: 455 | Disposition: A | Discharge status: Home / Self Care | Payer: Medicare Other | Attending: Neurological Surgery | Admitting: Neurological Surgery

## 2022-08-16 ENCOUNTER — Encounter (HOSPITAL_COMMUNITY): Payer: Self-pay | Admitting: Neurological Surgery

## 2022-08-16 ENCOUNTER — Inpatient Hospital Stay (HOSPITAL_COMMUNITY): Payer: Medicare Other | Admitting: Certified Registered"

## 2022-08-16 DIAGNOSIS — M47817 Spondylosis without myelopathy or radiculopathy, lumbosacral region: Secondary | ICD-10-CM | POA: Diagnosis present

## 2022-08-16 DIAGNOSIS — Z8249 Family history of ischemic heart disease and other diseases of the circulatory system: Secondary | ICD-10-CM | POA: Diagnosis not present

## 2022-08-16 DIAGNOSIS — Z881 Allergy status to other antibiotic agents status: Secondary | ICD-10-CM | POA: Diagnosis not present

## 2022-08-16 DIAGNOSIS — Z880 Allergy status to penicillin: Secondary | ICD-10-CM | POA: Diagnosis not present

## 2022-08-16 DIAGNOSIS — M4807 Spinal stenosis, lumbosacral region: Secondary | ICD-10-CM | POA: Diagnosis present

## 2022-08-16 DIAGNOSIS — M4317 Spondylolisthesis, lumbosacral region: Secondary | ICD-10-CM | POA: Diagnosis present

## 2022-08-16 DIAGNOSIS — Z7982 Long term (current) use of aspirin: Secondary | ICD-10-CM

## 2022-08-16 DIAGNOSIS — Z886 Allergy status to analgesic agent status: Secondary | ICD-10-CM | POA: Diagnosis not present

## 2022-08-16 DIAGNOSIS — M4187 Other forms of scoliosis, lumbosacral region: Secondary | ICD-10-CM | POA: Diagnosis present

## 2022-08-16 DIAGNOSIS — Z79899 Other long term (current) drug therapy: Secondary | ICD-10-CM | POA: Diagnosis not present

## 2022-08-16 DIAGNOSIS — Z91041 Radiographic dye allergy status: Secondary | ICD-10-CM

## 2022-08-16 DIAGNOSIS — M419 Scoliosis, unspecified: Secondary | ICD-10-CM | POA: Diagnosis not present

## 2022-08-16 DIAGNOSIS — M47896 Other spondylosis, lumbar region: Secondary | ICD-10-CM | POA: Diagnosis not present

## 2022-08-16 DIAGNOSIS — Z955 Presence of coronary angioplasty implant and graft: Secondary | ICD-10-CM

## 2022-08-16 DIAGNOSIS — E785 Hyperlipidemia, unspecified: Secondary | ICD-10-CM | POA: Diagnosis present

## 2022-08-16 DIAGNOSIS — I2510 Atherosclerotic heart disease of native coronary artery without angina pectoris: Secondary | ICD-10-CM | POA: Diagnosis present

## 2022-08-16 DIAGNOSIS — E119 Type 2 diabetes mellitus without complications: Secondary | ICD-10-CM | POA: Diagnosis present

## 2022-08-16 DIAGNOSIS — Z794 Long term (current) use of insulin: Secondary | ICD-10-CM

## 2022-08-16 DIAGNOSIS — M47816 Spondylosis without myelopathy or radiculopathy, lumbar region: Secondary | ICD-10-CM | POA: Diagnosis present

## 2022-08-16 DIAGNOSIS — M48061 Spinal stenosis, lumbar region without neurogenic claudication: Secondary | ICD-10-CM | POA: Diagnosis present

## 2022-08-16 DIAGNOSIS — I252 Old myocardial infarction: Secondary | ICD-10-CM

## 2022-08-16 DIAGNOSIS — M4186 Other forms of scoliosis, lumbar region: Secondary | ICD-10-CM | POA: Diagnosis present

## 2022-08-16 DIAGNOSIS — I10 Essential (primary) hypertension: Secondary | ICD-10-CM | POA: Diagnosis present

## 2022-08-16 DIAGNOSIS — M4316 Spondylolisthesis, lumbar region: Secondary | ICD-10-CM | POA: Diagnosis present

## 2022-08-16 DIAGNOSIS — Z85528 Personal history of other malignant neoplasm of kidney: Secondary | ICD-10-CM | POA: Diagnosis not present

## 2022-08-16 DIAGNOSIS — Z981 Arthrodesis status: Principal | ICD-10-CM

## 2022-08-16 DIAGNOSIS — K219 Gastro-esophageal reflux disease without esophagitis: Secondary | ICD-10-CM | POA: Diagnosis present

## 2022-08-16 HISTORY — PX: POSTERIOR LUMBAR FUSION 4 LEVEL: SHX6037

## 2022-08-16 LAB — GLUCOSE, CAPILLARY
Glucose-Capillary: 141 mg/dL — ABNORMAL HIGH (ref 70–99)
Glucose-Capillary: 115 mg/dL — ABNORMAL HIGH (ref 70–99)
Glucose-Capillary: 184 mg/dL — ABNORMAL HIGH (ref 70–99)

## 2022-08-16 LAB — ABO/RH: ABO/RH(D): O POS

## 2022-08-16 SURGERY — POSTERIOR LUMBAR FUSION 4 LEVEL
Anesthesia: General | Site: Spine Lumbar

## 2022-08-16 MED ORDER — INSULIN ASPART 100 UNIT/ML IJ SOLN
0.0000 [IU] | INTRAMUSCULAR | Status: DC | PRN
  Administered 2022-08-16: 2 [IU] via SUBCUTANEOUS

## 2022-08-16 MED ORDER — PROPOFOL 10 MG/ML IV BOLUS
INTRAVENOUS | Status: AC
  Filled 2022-08-16: qty 20

## 2022-08-16 MED ORDER — HYDROMORPHONE HCL 1 MG/ML IJ SOLN
INTRAMUSCULAR | Status: AC
  Filled 2022-08-16: qty 1

## 2022-08-16 MED ORDER — VANCOMYCIN HCL IN DEXTROSE 1-5 GM/200ML-% IV SOLN: 1000.0000 mg | INTRAVENOUS | Status: DC

## 2022-08-16 MED ORDER — ALBUMIN HUMAN 5 % IV SOLN
12.5000 g | Freq: Once | INTRAVENOUS | Status: AC
  Administered 2022-08-16: 12.5 g via INTRAVENOUS

## 2022-08-16 MED ORDER — ONDANSETRON HCL 4 MG/2ML IJ SOLN: 4.0000 mg | Freq: Four times a day (QID) | INTRAMUSCULAR | Status: DC | PRN

## 2022-08-16 MED ORDER — HYDROMORPHONE HCL 1 MG/ML IJ SOLN
INTRAMUSCULAR | Status: AC
  Filled 2022-08-16: qty 0.5

## 2022-08-16 MED ORDER — KETAMINE HCL 50 MG/5ML IJ SOSY
PREFILLED_SYRINGE | INTRAMUSCULAR | Status: AC
  Filled 2022-08-16: qty 10

## 2022-08-16 MED ORDER — GLYCOPYRROLATE PF 0.2 MG/ML IJ SOSY
PREFILLED_SYRINGE | INTRAMUSCULAR | Status: DC | PRN
  Administered 2022-08-16 (×2): .2 mg via INTRAVENOUS

## 2022-08-16 MED ORDER — VASOPRESSIN 20 UNIT/ML IV SOLN
INTRAVENOUS | Status: AC
  Filled 2022-08-16: qty 1

## 2022-08-16 MED ORDER — ROCURONIUM BROMIDE 10 MG/ML (PF) SYRINGE
PREFILLED_SYRINGE | INTRAVENOUS | Status: DC | PRN
  Administered 2022-08-16: 40 mg via INTRAVENOUS
  Administered 2022-08-16 (×2): 20 mg via INTRAVENOUS
  Administered 2022-08-16: 60 mg via INTRAVENOUS
  Administered 2022-08-16: 10 mg via INTRAVENOUS

## 2022-08-16 MED ORDER — ONDANSETRON HCL 4 MG PO TABS: 4.0000 mg | ORAL_TABLET | Freq: Four times a day (QID) | ORAL | Status: DC | PRN

## 2022-08-16 MED ORDER — LISINOPRIL 10 MG PO TABS
10.0000 mg | ORAL_TABLET | Freq: Every day | ORAL | Status: DC
  Administered 2022-08-17: 10 mg via ORAL
  Filled 2022-08-16: qty 1

## 2022-08-16 MED ORDER — AMISULPRIDE (ANTIEMETIC) 5 MG/2ML IV SOLN
10.0000 mg | Freq: Once | INTRAVENOUS | Status: AC | PRN
  Administered 2022-08-16: 10 mg via INTRAVENOUS

## 2022-08-16 MED ORDER — PANTOPRAZOLE SODIUM 40 MG PO TBEC
40.0000 mg | DELAYED_RELEASE_TABLET | Freq: Every day | ORAL | Status: DC
  Administered 2022-08-16 – 2022-08-17 (×2): 40 mg via ORAL
  Filled 2022-08-16 (×2): qty 1

## 2022-08-16 MED ORDER — LACTATED RINGERS IV SOLN: INTRAVENOUS | Status: DC

## 2022-08-16 MED ORDER — POTASSIUM CHLORIDE IN NACL 20-0.9 MEQ/L-% IV SOLN: INTRAVENOUS | Status: DC

## 2022-08-16 MED ORDER — SUGAMMADEX SODIUM 200 MG/2ML IV SOLN
INTRAVENOUS | Status: DC | PRN
  Administered 2022-08-16: 200 mg via INTRAVENOUS

## 2022-08-16 MED ORDER — ONDANSETRON HCL 4 MG/2ML IJ SOLN
INTRAMUSCULAR | Status: DC | PRN
  Administered 2022-08-16: 4 mg via INTRAVENOUS

## 2022-08-16 MED ORDER — EPHEDRINE SULFATE-NACL 50-0.9 MG/10ML-% IV SOSY
PREFILLED_SYRINGE | INTRAVENOUS | Status: DC | PRN
  Administered 2022-08-16 (×2): 10 mg via INTRAVENOUS
  Administered 2022-08-16: 5 mg via INTRAVENOUS

## 2022-08-16 MED ORDER — TIZANIDINE HCL 4 MG PO TABS
4.0000 mg | ORAL_TABLET | Freq: Three times a day (TID) | ORAL | Status: DC | PRN
  Administered 2022-08-16 (×2): 4 mg via ORAL
  Filled 2022-08-16 (×4): qty 1

## 2022-08-16 MED ORDER — INSULIN ASPART 100 UNIT/ML IJ SOLN
INTRAMUSCULAR | Status: AC
  Filled 2022-08-16: qty 1

## 2022-08-16 MED ORDER — ALBUMIN HUMAN 5 % IV SOLN: INTRAVENOUS | Status: DC | PRN

## 2022-08-16 MED ORDER — LIDOCAINE 2% (20 MG/ML) 5 ML SYRINGE
INTRAMUSCULAR | Status: DC | PRN
  Administered 2022-08-16: 50 mg via INTRAVENOUS

## 2022-08-16 MED ORDER — THROMBIN 20000 UNITS EX SOLR
CUTANEOUS | Status: AC
  Filled 2022-08-16: qty 20000

## 2022-08-16 MED ORDER — DEXAMETHASONE SODIUM PHOSPHATE 4 MG/ML IJ SOLN
4.0000 mg | Freq: Four times a day (QID) | INTRAMUSCULAR | Status: DC
  Administered 2022-08-16 – 2022-08-17 (×3): 4 mg via INTRAVENOUS
  Filled 2022-08-16 (×3): qty 1

## 2022-08-16 MED ORDER — ATENOLOL 50 MG PO TABS: 50.0000 mg | ORAL_TABLET | Freq: Every evening | ORAL | Status: DC

## 2022-08-16 MED ORDER — ACETAMINOPHEN 500 MG PO TABS
1000.0000 mg | ORAL_TABLET | Freq: Once | ORAL | Status: AC
  Administered 2022-08-16: 1000 mg via ORAL

## 2022-08-16 MED ORDER — SODIUM CHLORIDE 0.9% FLUSH: 3.0000 mL | Freq: Two times a day (BID) | INTRAVENOUS | Status: DC

## 2022-08-16 MED ORDER — HYDROMORPHONE HCL 1 MG/ML IJ SOLN
INTRAMUSCULAR | Status: DC | PRN
  Administered 2022-08-16 (×2): .5 mg via INTRAVENOUS

## 2022-08-16 MED ORDER — OXYCODONE HCL 5 MG PO TABS
10.0000 mg | ORAL_TABLET | ORAL | Status: DC | PRN
  Administered 2022-08-16 – 2022-08-17 (×4): 10 mg via ORAL
  Filled 2022-08-16 (×4): qty 2

## 2022-08-16 MED ORDER — ACETAMINOPHEN 500 MG PO TABS
1000.0000 mg | ORAL_TABLET | Freq: Four times a day (QID) | ORAL | Status: DC
  Administered 2022-08-16 – 2022-08-17 (×2): 1000 mg via ORAL
  Filled 2022-08-16 (×3): qty 2

## 2022-08-16 MED ORDER — BUPIVACAINE HCL (PF) 0.25 % IJ SOLN
INTRAMUSCULAR | Status: DC | PRN
  Administered 2022-08-16: 6 mL

## 2022-08-16 MED ORDER — FENTANYL CITRATE (PF) 250 MCG/5ML IJ SOLN
INTRAMUSCULAR | Status: DC | PRN
  Administered 2022-08-16 (×2): 50 ug via INTRAVENOUS
  Administered 2022-08-16: 100 ug via INTRAVENOUS
  Administered 2022-08-16: 50 ug via INTRAVENOUS

## 2022-08-16 MED ORDER — SODIUM CHLORIDE 0.9% FLUSH: 3.0000 mL | INTRAVENOUS | Status: DC | PRN

## 2022-08-16 MED ORDER — ALBUMIN HUMAN 5 % IV SOLN
INTRAVENOUS | Status: AC
  Filled 2022-08-16: qty 250

## 2022-08-16 MED ORDER — GLYCOPYRROLATE PF 0.2 MG/ML IJ SOSY
PREFILLED_SYRINGE | INTRAMUSCULAR | Status: AC
  Filled 2022-08-16: qty 2

## 2022-08-16 MED ORDER — THROMBIN 5000 UNITS EX SOLR
CUTANEOUS | Status: AC
  Filled 2022-08-16: qty 5000

## 2022-08-16 MED ORDER — CHLORHEXIDINE GLUCONATE 0.12 % MT SOLN
15.0000 mL | Freq: Once | OROMUCOSAL | Status: AC
  Administered 2022-08-16: 15 mL via OROMUCOSAL
  Filled 2022-08-16: qty 15

## 2022-08-16 MED ORDER — PROPOFOL 10 MG/ML IV BOLUS
INTRAVENOUS | Status: DC | PRN
  Administered 2022-08-16: 100 mg via INTRAVENOUS
  Administered 2022-08-16: 50 mg via INTRAVENOUS

## 2022-08-16 MED ORDER — DEXMEDETOMIDINE HCL IN NACL 80 MCG/20ML IV SOLN
INTRAVENOUS | Status: DC | PRN
  Administered 2022-08-16 (×2): 8 ug via BUCCAL
  Administered 2022-08-16: 4 ug via BUCCAL
  Administered 2022-08-16: 8 ug via BUCCAL
  Administered 2022-08-16: 4 ug via BUCCAL
  Administered 2022-08-16: 12 ug via BUCCAL
  Administered 2022-08-16 (×2): 8 ug via BUCCAL
  Administered 2022-08-16: 4 ug via BUCCAL

## 2022-08-16 MED ORDER — SODIUM CHLORIDE 0.9 % IV SOLN
250.0000 mL | INTRAVENOUS | Status: DC
  Administered 2022-08-16: 250 mL via INTRAVENOUS

## 2022-08-16 MED ORDER — HYDROMORPHONE HCL 1 MG/ML IJ SOLN: 1.0000 mg | INTRAMUSCULAR | Status: DC | PRN

## 2022-08-16 MED ORDER — THROMBIN 20000 UNITS EX SOLR: CUTANEOUS | Status: DC | PRN

## 2022-08-16 MED ORDER — ACETAMINOPHEN 10 MG/ML IV SOLN
INTRAVENOUS | Status: AC
  Filled 2022-08-16: qty 100

## 2022-08-16 MED ORDER — DEXAMETHASONE 4 MG PO TABS: 4.0000 mg | ORAL_TABLET | Freq: Four times a day (QID) | ORAL | Status: DC

## 2022-08-16 MED ORDER — ONDANSETRON HCL 4 MG/2ML IJ SOLN
INTRAMUSCULAR | Status: AC
  Filled 2022-08-16: qty 2

## 2022-08-16 MED ORDER — PHENYLEPHRINE HCL-NACL 20-0.9 MG/250ML-% IV SOLN
INTRAVENOUS | Status: DC | PRN
  Administered 2022-08-16: 50 ug/min via INTRAVENOUS

## 2022-08-16 MED ORDER — MIDAZOLAM HCL 2 MG/2ML IJ SOLN
INTRAMUSCULAR | Status: DC | PRN
  Administered 2022-08-16: 2 mg via INTRAVENOUS

## 2022-08-16 MED ORDER — MIDAZOLAM HCL 2 MG/2ML IJ SOLN
INTRAMUSCULAR | Status: AC
  Filled 2022-08-16: qty 2

## 2022-08-16 MED ORDER — VASOPRESSIN 20 UNIT/ML IV SOLN
INTRAVENOUS | Status: DC | PRN
  Administered 2022-08-16 (×4): .5 [IU] via INTRAVENOUS

## 2022-08-16 MED ORDER — DEXMEDETOMIDINE HCL IN NACL 80 MCG/20ML IV SOLN
INTRAVENOUS | Status: AC
  Filled 2022-08-16: qty 20

## 2022-08-16 MED ORDER — KETAMINE HCL 10 MG/ML IJ SOLN
INTRAMUSCULAR | Status: DC | PRN
  Administered 2022-08-16 (×2): 10 mg via INTRAVENOUS
  Administered 2022-08-16: 50 mg via INTRAVENOUS
  Administered 2022-08-16 (×3): 10 mg via INTRAVENOUS

## 2022-08-16 MED ORDER — PHENYLEPHRINE 80 MCG/ML (10ML) SYRINGE FOR IV PUSH (FOR BLOOD PRESSURE SUPPORT)
PREFILLED_SYRINGE | INTRAVENOUS | Status: AC
  Filled 2022-08-16: qty 10

## 2022-08-16 MED ORDER — BUPIVACAINE HCL (PF) 0.25 % IJ SOLN
INTRAMUSCULAR | Status: AC
  Filled 2022-08-16: qty 30

## 2022-08-16 MED ORDER — PHENOL 1.4 % MT LIQD: 1.0000 | OROMUCOSAL | Status: DC | PRN

## 2022-08-16 MED ORDER — ASPIRIN EC 81 MG PO TBEC
81.0000 mg | DELAYED_RELEASE_TABLET | Freq: Every day | ORAL | Status: DC
  Administered 2022-08-16 – 2022-08-17 (×2): 81 mg via ORAL
  Filled 2022-08-16 (×3): qty 1

## 2022-08-16 MED ORDER — MENTHOL 3 MG MT LOZG: 1.0000 | LOZENGE | OROMUCOSAL | Status: DC | PRN

## 2022-08-16 MED ORDER — EPHEDRINE 5 MG/ML INJ
INTRAVENOUS | Status: AC
  Filled 2022-08-16: qty 5

## 2022-08-16 MED ORDER — 0.9 % SODIUM CHLORIDE (POUR BTL) OPTIME
TOPICAL | Status: DC | PRN
  Administered 2022-08-16: 1000 mL

## 2022-08-16 MED ORDER — ORAL CARE MOUTH RINSE: 15.0000 mL | Freq: Once | OROMUCOSAL | Status: AC

## 2022-08-16 MED ORDER — ACETAMINOPHEN 10 MG/ML IV SOLN
1000.0000 mg | Freq: Once | INTRAVENOUS | Status: AC
  Administered 2022-08-16: 1000 mg via INTRAVENOUS

## 2022-08-16 MED ORDER — GABAPENTIN 300 MG PO CAPS: 300.0000 mg | ORAL_CAPSULE | ORAL | Status: DC

## 2022-08-16 MED ORDER — ROCURONIUM BROMIDE 10 MG/ML (PF) SYRINGE
PREFILLED_SYRINGE | INTRAVENOUS | Status: AC
  Filled 2022-08-16: qty 20

## 2022-08-16 MED ORDER — AMISULPRIDE (ANTIEMETIC) 5 MG/2ML IV SOLN
INTRAVENOUS | Status: AC
  Filled 2022-08-16: qty 4

## 2022-08-16 MED ORDER — FENTANYL CITRATE (PF) 250 MCG/5ML IJ SOLN
INTRAMUSCULAR | Status: AC
  Filled 2022-08-16: qty 5

## 2022-08-16 MED ORDER — CLINDAMYCIN PHOSPHATE 600 MG/50ML IV SOLN
600.0000 mg | Freq: Once | INTRAVENOUS | Status: AC
  Administered 2022-08-16: 600 mg via INTRAVENOUS
  Filled 2022-08-16: qty 50

## 2022-08-16 MED ORDER — SENNA 8.6 MG PO TABS
1.0000 | ORAL_TABLET | Freq: Two times a day (BID) | ORAL | Status: DC
  Administered 2022-08-16 – 2022-08-17 (×2): 8.6 mg via ORAL
  Filled 2022-08-16 (×2): qty 1

## 2022-08-16 MED ORDER — CHLORHEXIDINE GLUCONATE CLOTH 2 % EX PADS: 6.0000 | MEDICATED_PAD | Freq: Once | CUTANEOUS | Status: DC

## 2022-08-16 MED ORDER — LIDOCAINE 2% (20 MG/ML) 5 ML SYRINGE
INTRAMUSCULAR | Status: AC
  Filled 2022-08-16: qty 5

## 2022-08-16 MED ORDER — RANOLAZINE ER 500 MG PO TB12
500.0000 mg | ORAL_TABLET | Freq: Two times a day (BID) | ORAL | Status: DC
  Administered 2022-08-16 – 2022-08-17 (×2): 500 mg via ORAL
  Filled 2022-08-16 (×3): qty 1

## 2022-08-16 MED ORDER — HYDROMORPHONE HCL 1 MG/ML IJ SOLN
0.2500 mg | INTRAMUSCULAR | Status: DC | PRN
  Administered 2022-08-16: 0.25 mg via INTRAVENOUS
  Administered 2022-08-16: 0.5 mg via INTRAVENOUS

## 2022-08-16 MED ORDER — ACETAMINOPHEN 500 MG PO TABS
1000.0000 mg | ORAL_TABLET | ORAL | Status: DC
  Filled 2022-08-16: qty 2

## 2022-08-16 MED ORDER — THROMBIN 5000 UNITS EX SOLR: OROMUCOSAL | Status: DC | PRN

## 2022-08-16 SURGICAL SUPPLY — 66 items
ADH SKN CLS APL DERMABOND .7 (GAUZE/BANDAGES/DRESSINGS) ×2
APL SKNCLS STERI-STRIP NONHPOA (GAUZE/BANDAGES/DRESSINGS) ×2
BAG COUNTER SPONGE SURGICOUNT (BAG) ×2 IMPLANT
BAG SPNG CNTER NS LX DISP (BAG) ×2
BASKET BONE COLLECTION (BASKET) ×2 IMPLANT
BENZOIN TINCTURE PRP APPL 2/3 (GAUZE/BANDAGES/DRESSINGS) ×2 IMPLANT
BLADE BONE MILL MEDIUM (MISCELLANEOUS) ×2 IMPLANT
BUR CARBIDE MATCH 3.0 (BURR) ×2 IMPLANT
CAGE POROUS ATEC 7X9X25 5D (Cage) ×2 IMPLANT
CANISTER SUCT 3000ML PPV (MISCELLANEOUS) ×2 IMPLANT
CNTNR URN SCR LID CUP LEK RST (MISCELLANEOUS) ×2 IMPLANT
CONT SPEC 4OZ STRL OR WHT (MISCELLANEOUS) ×2
COVER BACK TABLE 60X90IN (DRAPES) ×2 IMPLANT
COVERAGE SUPPORT O-ARM STEALTH (MISCELLANEOUS) ×2 IMPLANT
DERMABOND ADVANCED .7 DNX12 (GAUZE/BANDAGES/DRESSINGS) ×2 IMPLANT
DRAPE C-ARM 42X72 X-RAY (DRAPES) ×4 IMPLANT
DRAPE C-ARMOR (DRAPES) ×2 IMPLANT
DRAPE LAPAROTOMY 100X72X124 (DRAPES) ×2 IMPLANT
DRAPE SURG 17X23 STRL (DRAPES) ×2 IMPLANT
DRSG OPSITE POSTOP 4X10 (GAUZE/BANDAGES/DRESSINGS) ×1 IMPLANT
DURAPREP 26ML APPLICATOR (WOUND CARE) ×2 IMPLANT
ELECT REM PT RETURN 9FT ADLT (ELECTROSURGICAL) ×2
ELECTRODE REM PT RTRN 9FT ADLT (ELECTROSURGICAL) ×2 IMPLANT
EVACUATOR 1/8 PVC DRAIN (DRAIN) ×2 IMPLANT
FEE COVERAGE SUPPORT O-ARM (MISCELLANEOUS) ×1 IMPLANT
GLOVE BIO SURGEON STRL SZ7 (GLOVE) ×2 IMPLANT
GLOVE BIO SURGEON STRL SZ8 (GLOVE) ×4 IMPLANT
GLOVE BIOGEL PI IND STRL 7.0 (GLOVE) ×2 IMPLANT
GOWN STRL REUS W/ TWL LRG LVL3 (GOWN DISPOSABLE) ×2 IMPLANT
GOWN STRL REUS W/ TWL XL LVL3 (GOWN DISPOSABLE) ×5 IMPLANT
GOWN STRL REUS W/TWL 2XL LVL3 (GOWN DISPOSABLE) IMPLANT
GOWN STRL REUS W/TWL LRG LVL3 (GOWN DISPOSABLE) ×4
GOWN STRL REUS W/TWL XL LVL3 (GOWN DISPOSABLE) ×6
GRAFT BONE PROTEIOS XL 10CC (Orthopedic Implant) ×2 IMPLANT
HEMOSTAT POWDER KIT SURGIFOAM (HEMOSTASIS) ×2 IMPLANT
KIT BASIN OR (CUSTOM PROCEDURE TRAY) ×2 IMPLANT
KIT TURNOVER KIT B (KITS) ×2 IMPLANT
MATRIX STRIP NEOCORE 12C (Putty) ×2 IMPLANT
NDL HYPO 25X1 1.5 SAFETY (NEEDLE) ×1 IMPLANT
NEEDLE HYPO 25X1 1.5 SAFETY (NEEDLE) ×2 IMPLANT
NS IRRIG 1000ML POUR BTL (IV SOLUTION) ×2 IMPLANT
PACK LAMINECTOMY NEURO (CUSTOM PROCEDURE TRAY) ×2 IMPLANT
PAD ARMBOARD 7.5X6 YLW CONV (MISCELLANEOUS) ×6 IMPLANT
ROD LORD LIPPED 5.5X110 (Rod) ×2 IMPLANT
SCREW CORT SHANK MOD 6.5X40 (Screw) ×2 IMPLANT
SCREW ILIAC PA 5.5X40 (Screw) ×4 IMPLANT
SCREW KODIAK 6.5X45 (Screw) ×2 IMPLANT
SCREW PA INVICTUS 5.5X35 (Screw) ×2 IMPLANT
SCREW POLYAXIAL TULIP (Screw) ×2 IMPLANT
SET SCREW (Screw) ×20 IMPLANT
SET SCREW SPNE (Screw) ×10 IMPLANT
SPACER IDENTITI PS 8X9X25 15D (Spacer) ×2 IMPLANT
SPACER PS POROUS 8X9X25 10D (Spacer) ×4 IMPLANT
SPONGE SURGIFOAM ABS GEL 100 (HEMOSTASIS) ×2 IMPLANT
SPONGE T-LAP 4X18 ~~LOC~~+RFID (SPONGE) IMPLANT
STRIP CLOSURE SKIN 1/2X4 (GAUZE/BANDAGES/DRESSINGS) ×3 IMPLANT
STRIP MATRIX NEOCORE 12CC (Putty) ×4 IMPLANT
SUT VIC AB 0 CT1 18XCR BRD8 (SUTURE) ×3 IMPLANT
SUT VIC AB 0 CT1 8-18 (SUTURE) ×4
SUT VIC AB 2-0 CP2 18 (SUTURE) ×3 IMPLANT
SUT VIC AB 3-0 SH 8-18 (SUTURE) ×4 IMPLANT
SYR CONTROL 10ML LL (SYRINGE) ×2 IMPLANT
TOWEL GREEN STERILE (TOWEL DISPOSABLE) ×2 IMPLANT
TOWEL GREEN STERILE FF (TOWEL DISPOSABLE) ×2 IMPLANT
TRAY FOLEY MTR SLVR 16FR STAT (SET/KITS/TRAYS/PACK) ×2 IMPLANT
WATER STERILE IRR 1000ML POUR (IV SOLUTION) ×2 IMPLANT

## 2022-08-16 NOTE — Anesthesia Procedure Notes (Signed)
Procedure Name: Intubation Date/Time: 08/16/2022 7:45 AM  Performed by: Cathren Harsh, CRNAPre-anesthesia Checklist: Patient identified, Emergency Drugs available, Suction available and Patient being monitored Patient Re-evaluated:Patient Re-evaluated prior to induction Oxygen Delivery Method: Circle System Utilized Preoxygenation: Pre-oxygenation with 100% oxygen Induction Type: IV induction Ventilation: Mask ventilation without difficulty Laryngoscope Size: Mac and 3 Grade View: Grade II Tube type: Oral Tube size: 7.5 mm Number of attempts: 1 Airway Equipment and Method: Stylet and Oral airway Placement Confirmation: ETT inserted through vocal cords under direct vision, positive ETCO2 and breath sounds checked- equal and bilateral Secured at: 23 cm Tube secured with: Tape Dental Injury: Teeth and Oropharynx as per pre-operative assessment

## 2022-08-16 NOTE — Op Note (Signed)
08/16/2022  2:17 PM  PATIENT:  Adam Roach  66 y.o. male  PRE-OPERATIVE DIAGNOSIS: Lumbar spondylosis with lumbar spinal stenosis L2-3 L3-4 L4-5 L5-S1, spondylolisthesis L5-S1, scoliosis, previous hemilaminectomy L3-4 L4-5 at another institution  POST-OPERATIVE DIAGNOSIS:  same  PROCEDURE:   1. Decompressive lumbar laminectomy, hemi facetectomy and foraminotomies L2-3 L3-4 L4-5 L5-S1 requiring more work than would be required for a simple exposure of the disk for PLIF in order to adequately decompress the neural elements and address the spinal stenosis 2. Posterior lumbar interbody fusion L2-3 L3-4 L4-5 L5-S1 using PTI interbody cages packed with morcellized allograft and autograft  3. Posterior fixation L2-S1 inclusive using Alphatec cortical pedicle screws.  4. Intertransverse arthrodesis L2 to S1 inclusive using morcellized autograft and allograft.  SURGEON:  Sherley Bounds, MD  ASSISTANTS: Glenford Peers FNP  ANESTHESIA:  General  EBL: 675 ml  Total I/O In: 2250 [I.V.:1750; Blood:250; IV Piggyback:250] Out: 4268 [Urine:720; Blood:675]  BLOOD ADMINISTERED: 250 CC CELLSAVER  DRAINS: Bilateral medium Hemovac  INDICATION FOR PROCEDURE: This patient presented with back pain with leg pain and some difficulty with bladder control. Imaging revealed previous hemilaminectomy at L3-4 and L4-5 done at [facility] as well as spondylolisthesis and scoliosis and severe spinal stenosis. The patient tried a reasonable attempt at conservative medical measures without relief. I recommended decompression and instrumented fusion to address the stenosis as well as the segmental  instability.  Patient understood the risks, benefits, and alternatives and potential outcomes and wished to proceed.  PROCEDURE DETAILS:  The patient was brought to the operating room. After induction of generalized endotracheal anesthesia the patient was rolled into the prone position on chest rolls and all pressure points  were padded. The patient's lumbar region was cleaned and then prepped with DuraPrep and draped in the usual sterile fashion. Anesthesia was injected and then a dorsal midline incision was made and carried down to the lumbosacral fascia. The fascia was opened and the paraspinous musculature was taken down in a subperiosteal fashion to expose L2-S1. A self-retaining retractor was placed. Intraoperative fluoroscopy confirmed my level, and I started with placement of the L2 cortical pedicle screws. The pedicle screw entry zones were identified utilizing surface landmarks and  AP and lateral fluoroscopy. I scored the cortex with the high-speed drill and then used the hand drill to drill an upward and outward direction into the pedicle. I then tapped line to line. I then placed a 6.5 x 40 mm cortical pedicle screw into the pedicles of L2 bilaterally.    I then turned my attention to the decompression and complete lumbar laminectomies, hemi- facetectomies, and foraminotomies were performed at L2-3 L3-4 L4-5 and L5-S1.  My nurse practitioner was directly involved in the decompression and exposure of the neural elements. the patient had significant spinal stenosis and this required more work than would be required for a simple exposure of the disc for posterior lumbar interbody fusion which would only require a limited laminotomy. Much more generous decompression and generous foraminotomy was undertaken in order to adequately decompress the neural elements and address the patient's leg pain. The yellow ligament was removed to expose the underlying dura and nerve roots, and generous foraminotomies were performed to adequately decompress the neural elements. Both the exiting and traversing nerve roots were decompressed on both sides until a coronary dilator passed easily along the nerve roots. Once the decompression was complete, I turned my attention to the posterior lower lumbar interbody fusion. The epidural venous  vasculature was coagulated  and cut sharply. Disc space was incised and the initial discectomy was performed with pituitary rongeurs. The disc space was distracted with sequential distractors to a height of 8 mm at L2-3 and L3-4, 7 mm at L4-5, and 9 mm at L5-S1. We then used a series of scrapers and shavers to prepare the endplates for fusion. The midline was prepared with Epstein curettes. Once the complete discectomy was finished, we packed an appropriate sized interbody cage with local autograft and morcellized allograft, gently retracted the nerve root, and tapped the cage into position at L2-3 L3-4 L4-5 and L5-S1.  The midline between the cages was packed with morselized autograft and allograft.   We then turned our attention to the placement of the lower pedicle screws. The pedicle screw entry zones were identified utilizing surface landmarks and fluoroscopy. I drilled into each pedicle utilizing the hand drill, and tapped each pedicle with the appropriate tap. We palpated with a ball probe to assure no break in the cortex. We then placed 5.5 x 40 mm pedicle screws into the pedicles bilaterally at L3-L4, and 5.5 x 35 mm screws at L5, and 6.5 x 45 mm screws achieving bicortical purchase at 1.  My nurse practitioner assisted in placement of the pedicle screws.  We then decorticated the transverse processes and laid a mixture of morcellized autograft and allograft out over these to perform intertransverse arthrodesis at L2-S1. We then placed lordotic rods into the multiaxial screw heads of the pedicle screws and locked these in position with the locking caps and anti-torque device. We then checked our construct with AP and lateral fluoroscopy. Irrigated with copious amounts of bacitracin-containing saline solution. Inspected the nerve roots once again to assure adequate decompression, lined to the dura with Gelfoam, bilateral Hemovac drains and then we closed the muscle and the fascia with 0 Vicryl. Closed the  subcutaneous tissues with 2-0 Vicryl and subcuticular tissues with 3-0 Vicryl. The skin was closed with benzoin and Steri-Strips. Dressing was then applied, the patient was awakened from general anesthesia and transported to the recovery room in stable condition. At the end of the procedure all sponge, needle and instrument counts were correct.   PLAN OF CARE: admit to inpatient  PATIENT DISPOSITION:  PACU - hemodynamically stable.   Delay start of Pharmacological VTE agent (>24hrs) due to surgical blood loss or risk of bleeding:  yes

## 2022-08-16 NOTE — Anesthesia Postprocedure Evaluation (Signed)
Anesthesia Post Note  Patient: Adam Roach  Procedure(s) Performed: LUMBAR TWO-THREE, LUMBAR THREE-FOUR, LUMBAR FOUR-FIVE, LUMBAR FIVE-SACRAL ONE POSTERIOR LUMBAR INTERBODY FUSION (Spine Lumbar)     Patient location during evaluation: PACU Anesthesia Type: General Level of consciousness: awake and alert, oriented and patient cooperative Pain management: pain level controlled Vital Signs Assessment: post-procedure vital signs reviewed and stable Respiratory status: spontaneous breathing, nonlabored ventilation and respiratory function stable Cardiovascular status: blood pressure returned to baseline and stable Postop Assessment: no apparent nausea or vomiting Anesthetic complications: no Comments: BP stable MAPS>65 after 2 bottles of albumin in PACU    No notable events documented.  Last Vitals:  Vitals:   08/16/22 1530 08/16/22 1545  BP: (!) 104/50   Pulse: (!) 55   Resp: 12   Temp:    SpO2: 96% 98%                  Pervis Hocking

## 2022-08-16 NOTE — H&P (Signed)
Subjective: Patient is a 66 y.o. male admitted for spinal stenosis. Onset of symptoms was several months ago, gradually worsening since that time.  The pain is rated severe, and is located at the across the lower back and radiates to legs with numbness. The pain is described as aching and occurs all day. The symptoms have been progressive. Symptoms are exacerbated by exercise, standing, and walking for more than a few minutes. MRI or CT showed severe stenosis. Previous surgery at another institution.   Past Medical History:  Diagnosis Date   Arthritis    Cancer [facility]) 2012   Right clear cell renal cancer, s/p right partial nephrectomy 08/17/11   Coronary artery disease    inferior MI, s/p RCA stent 2002; DES ostimal ramus 08/19/19   Diabetes mellitus without complication ([place])    Type 2   Dysrhythmia    afib, s/p ablation 09/14/20, no known recurrence (08/08/22)   GERD (gastroesophageal reflux disease)    Hyperlipemia    Hypertension    Myocardial infarction [facility]) 2002   RCA stent [facility])   Wears glasses     Past Surgical History:  Procedure Laterality Date   CARDIAC CATHETERIZATION     RCA stent 2002; DES ostial ramus 08/19/19   CARPAL TUNNEL RELEASE Right 12/20/2014   Procedure: RIGHT CARPAL TUNNEL RELEASE;  Surgeon: Leanora Cover, MD;  Location: [place];  Service: Orthopedics;  Laterality: Right;   ELBOW ARTHROSCOPY  09/30/2008   left   FINGER GANGLION CYST EXCISION     rt thumb   GANGLION CYST EXCISION     lt wrist   GANGLION CYST EXCISION Right 12/20/2014   Procedure: REMOVAL GANGLION OF WRIST;  Surgeon: Leanora Cover, MD;  Location: [place];  Service: Orthopedics;  Laterality: Right;   PARTIAL NEPHRECTOMY  08/27/2011   right   SHOULDER ARTHROSCOPY  10/01/2007   left   TONSILLECTOMY     ULNAR NERVE REPAIR  09/30/2010   left   ULNAR NERVE TRANSPOSITION Right 12/20/2014   Procedure: RIGHT ULNAR NERVE DECOMPRESSION,  TRANSPOSITION;  Surgeon: Leanora Cover, MD;  Location: [place];  Service: Orthopedics;  Laterality: Right;    Prior to Admission medications   Medication Sig Start Date End Date Taking? Authorizing Provider  aspirin 81 MG tablet Take 81 mg by mouth daily.   Yes [provider]  atenolol (TENORMIN) 50 MG tablet Take 50 mg by mouth every evening.   Yes [provider]  atorvastatin (LIPITOR) 80 MG tablet Take 80 mg by mouth daily.   Yes [provider]  HYDROcodone-acetaminophen (NORCO/VICODIN) 5-325 MG tablet Take 1 tablet by mouth every 6 (six) hours as needed for moderate pain. 08/06/22  Yes [provider]  LEVEMIR FLEXPEN 100 UNIT/ML FlexPen Inject 16 Units into the skin every evening. 12/05/15  Yes [provider]  lisinopril (ZESTRIL) 10 MG tablet Take 10 mg by mouth daily.   Yes [provider]  METAMUCIL FIBER PO Take 1 Dose by mouth every evening.   Yes [provider]  Multiple Vitamins-Minerals (PRESERVISION AREDS 2 PO) Take 1 tablet by mouth in the morning and at bedtime.   Yes [provider]  nitroGLYCERIN (NITROSTAT) 0.4 MG SL tablet Place 0.4 mg under the tongue every 5 (five) minutes as needed for chest pain.   Yes [provider]  omeprazole (PRILOSEC) 40 MG capsule Take 40 mg by mouth in the morning and at bedtime.   Yes  [provider]  ranolazine (RANEXA) 500 MG 12 hr tablet Take 500 mg by mouth 2 (two) times daily.   Yes [provider]  tiZANidine (ZANAFLEX) 4 MG tablet Take 4 mg by mouth every 8 (eight) hours as needed for muscle spasms.   Yes [provider]  traZODone (DESYREL) 50 MG tablet Take 100 mg by mouth at bedtime. Prn sleep 08/25/15  Yes [provider]  sildenafil (REVATIO) 20 MG tablet Take 2-5 tablets by mouth as needed (sexual disfunction). 05/15/22   [provider]   Allergies  Allergen Reactions   Ezetimibe Shortness Of  Breath    Chest pain and breathing issues   Penicillins Anaphylaxis   Vancomycin Rash and Anaphylaxis   Ciprofloxacin In D5w Rash   Aspirin Other (See Comments)    At full strength, stomach irritation   Contrast Media [Iodinated Contrast Media] Hives   Ibuprofen Other (See Comments)    Stomach irritation    Ioxaglate Hives   Ciprofloxacin Rash   Evolocumab Hives and Rash   Gabapentin Hives and Rash    Social History   Tobacco Use   Smoking status: Never   Smokeless tobacco: Never  Substance Use Topics   Alcohol use: No    Comment: last 2013    Family History  Problem Relation Age of Onset   Heart disease Father      Review of Systems  Positive ROS: neg  All other systems have been reviewed and were otherwise negative with the exception of those mentioned in the HPI and as above.  Objective: Vital signs in last 24 hours: Temp:  [97.6 F (36.4 C)] 97.6 F (36.4 C) (11/17 0600) Pulse Rate:  [47] 47 (11/17 0600) Resp:  [17] 17 (11/17 0600) BP: (120)/(57) 120/57 (11/17 0600) SpO2:  [96 %] 96 % (11/17 0600) Weight:  [73.9 kg] 73.9 kg (11/17 0600)  General Appearance: Alert, cooperative, no distress, appears stated age Head: Normocephalic, without obvious abnormality, atraumatic Eyes: PERRL, conjunctiva/corneas clear, EOM's intact    Neck: Supple, symmetrical, trachea midline Back: Symmetric, no curvature, ROM normal, no CVA tenderness Lungs:  respirations unlabored Heart: Regular rate and rhythm Abdomen: Soft, non-tender Extremities: Extremities normal, atraumatic, no cyanosis or edema Pulses: 2+ and symmetric all extremities Skin: Skin color, texture, turgor normal, no rashes or lesions  NEUROLOGIC:   Mental status: Alert and oriented x4,  no aphasia, good attention span, fund of knowledge, and memory Motor Exam - grossly normal Sensory Exam - grossly normal Reflexes: 1= Coordination - grossly normal Gait - grossly normal Balance - grossly normal Cranial  Nerves: I: smell Not tested  II: visual acuity  OS: nl    OD: nl  II: visual fields Full to confrontation  II: pupils Equal, round, reactive to light  III,VII: ptosis None  III,IV,VI: extraocular muscles  Full ROM  V: mastication Normal  V: facial light touch sensation  Normal  V,VII: corneal reflex  Present  VII: facial muscle function - upper  Normal  VII: facial muscle function - lower Normal  VIII: hearing Not tested  IX: soft palate elevation  Normal  IX,X: gag reflex Present  XI: trapezius strength  5/5  XI: sternocleidomastoid strength 5/5  XI: neck flexion strength  5/5  XII: tongue strength  Normal    Data Review Lab Results  Component Value Date   WBC 6.4 08/07/2022   HGB 13.3 08/07/2022   HCT 39.7 08/07/2022   MCV 89.2 08/07/2022   PLT 198  08/07/2022   Lab Results  Component Value Date   NA 136 08/07/2022   K 5.0 08/07/2022   CL 103 08/07/2022   CO2 27 08/07/2022   BUN 14 08/07/2022   CREATININE 0.77 08/07/2022   GLUCOSE 146 (H) 08/07/2022   Lab Results  Component Value Date   INR 1.0 08/07/2022    Assessment/Plan:  Estimated body mass index is 27.12 kg/m as calculated from the following:   Height as of this encounter: '5\' 5"'$  (1.651 m).   Weight as of this encounter: 73.9 kg. Patient admitted for PLIF L2-3 L3-4 L4-5 L5-S1. Patient has failed a reasonable attempt at conservative therapy.  I explained the condition and procedure to the patient and answered any questions.  Patient wishes to proceed with procedure as planned. Understands risks/ benefits and typical outcomes of procedure.   Eustace Moore 08/16/2022 7:16 AM

## 2022-08-16 NOTE — Plan of Care (Signed)

## 2022-08-16 NOTE — Anesthesia Procedure Notes (Signed)
Arterial Line Insertion Start/End11/17/2023 8:00 AM, 08/16/2022 8:22 AM Performed by: Albertha Ghee, MD, Roslynn Amble, CRNA, anesthesiologist  Patient location: Pre-op. Preanesthetic checklist: patient identified, IV checked, site marked, risks and benefits discussed, surgical consent, monitors and equipment checked, pre-op evaluation, timeout performed and anesthesia consent Lidocaine 1% used for infiltration radial was placed Catheter size: 20 G Hand hygiene performed  and maximum sterile barriers used   Attempts: 3 (two unsuccessful by crna, one successful by a. hodierne) Procedure performed using ultrasound guided technique. Ultrasound Notes:anatomy identified, needle tip was noted to be adjacent to the nerve/plexus identified and no ultrasound evidence of intravascular and/or intraneural injection Following insertion, dressing applied. Post procedure assessment: normal and unchanged  Post procedure complications: unsuccessful attempts and second provider assisted.

## 2022-08-16 NOTE — Transfer of Care (Signed)
Immediate Anesthesia Transfer of Care Note  Patient: Adam Roach  Procedure(s) Performed: LUMBAR TWO-THREE, LUMBAR THREE-FOUR, LUMBAR FOUR-FIVE, LUMBAR FIVE-SACRAL ONE POSTERIOR LUMBAR INTERBODY FUSION (Spine Lumbar)  Patient Location: PACU  Anesthesia Type:General  Level of Consciousness: awake, drowsy, patient cooperative, and responds to stimulation  Airway & Oxygen Therapy: Patient Spontanous Breathing and Patient connected to face mask oxygen  Post-op Assessment: Report given to RN and Post -op Vital signs reviewed and stable  Post vital signs: Reviewed and stable  Last Vitals:  Vitals Value Taken Time  BP 125/70 08/16/22 1412  Temp    Pulse 72 08/16/22 1414  Resp 18 08/16/22 1414  SpO2 100 % 08/16/22 1414  Vitals shown include unvalidated device data.  Last Pain:  Vitals:   08/16/22 0618  TempSrc:   PainSc: 10-Worst pain ever         Complications: No notable events documented.

## 2022-08-17 LAB — GLUCOSE, CAPILLARY: Glucose-Capillary: 183 mg/dL — ABNORMAL HIGH (ref 70–99)

## 2022-08-17 MED ORDER — INSULIN ASPART 100 UNIT/ML IJ SOLN: 0.0000 [IU] | Freq: Every day | INTRAMUSCULAR | Status: DC

## 2022-08-17 MED ORDER — OXYCODONE HCL 10 MG PO TABS: 10.0000 mg | ORAL_TABLET | ORAL | 0 refills | Status: DC | PRN

## 2022-08-17 MED ORDER — INSULIN ASPART 100 UNIT/ML IJ SOLN
0.0000 [IU] | Freq: Three times a day (TID) | INTRAMUSCULAR | Status: DC
  Administered 2022-08-17: 3 [IU] via SUBCUTANEOUS

## 2022-08-17 MED ORDER — TIZANIDINE HCL 4 MG PO TABS: 4.0000 mg | ORAL_TABLET | Freq: Three times a day (TID) | ORAL | 0 refills | Status: DC | PRN

## 2022-08-17 NOTE — Progress Notes (Signed)
Patient alert and oriented, void, surgical site clean and dry no sign of infection. Drain pulled per order. D/c instructions explain and copy given to the patient  all questions answered. Pt. D/c home per order.

## 2022-08-17 NOTE — Discharge Instructions (Signed)

## 2022-08-17 NOTE — Discharge Summary (Signed)
Physician Discharge Summary  Patient ID: Adam Roach MRN: [id number] DOB/AGE: 66-Feb-1957 66 y.o.  Admit date: 08/16/2022 Discharge date: 08/17/2022  Admission Diagnoses:  Discharge Diagnoses:  Principal Problem:   S/P lumbar fusion   Discharged Condition: good  Hospital Course: Patient admitted to the hospital where he underwent uncomplicated multilevel lumbar decompression and fusion surgery.  Postoperatively doing very well.  Preoperative back pain well controlled.  Standing ambulating and voiding without difficulty.  Still with a little bit of numbness in his left leg but no weakness.  Patient feels ready for discharge home.  Consults:   Significant Diagnostic Studies:   Treatments:   Discharge Exam: Blood pressure 130/68, pulse 83, temperature 98.6 F (37 C), temperature source Oral, resp. rate 17, height '5\' 5"'$  (1.651 m), weight 73.9 kg, SpO2 98 %. Awake and alert.  Oriented and appropriate.  Motor and sensory function intact.  Wound clean and dry.  Chest and abdomen benign.  Disposition: Discharge disposition: 01-Home or Self Care        Allergies as of 08/17/2022       Reactions   Ezetimibe Shortness Of Breath   Chest pain and breathing issues   Penicillins Anaphylaxis   Vancomycin Rash, Anaphylaxis   Ciprofloxacin In D5w Rash   Aspirin Other (See Comments)   At full strength, stomach irritation   Contrast Media [iodinated Contrast Media] Hives   Ibuprofen Other (See Comments)   Stomach irritation   Ioxaglate Hives   Ciprofloxacin Rash   Evolocumab Hives, Rash   Gabapentin Hives, Rash        Medication List     TAKE these medications    aspirin 81 MG tablet Take 81 mg by mouth daily.   atenolol 50 MG tablet Commonly known as: TENORMIN Take 50 mg by mouth every evening.   atorvastatin 80 MG tablet Commonly known as: LIPITOR Take 80 mg by mouth daily.   HYDROcodone-acetaminophen 5-325 MG tablet Commonly known as: NORCO/VICODIN Take  1 tablet by mouth every 6 (six) hours as needed for moderate pain.   Levemir FlexPen 100 UNIT/ML FlexPen Generic drug: insulin detemir Inject 16 Units into the skin every evening.   lisinopril 10 MG tablet Commonly known as: ZESTRIL Take 10 mg by mouth daily.   METAMUCIL FIBER PO Take 1 Dose by mouth every evening.   nitroGLYCERIN 0.4 MG SL tablet Commonly known as: NITROSTAT Place 0.4 mg under the tongue every 5 (five) minutes as needed for chest pain.   omeprazole 40 MG capsule Commonly known as: PRILOSEC Take 40 mg by mouth in the morning and at bedtime.   Oxycodone HCl 10 MG Tabs Take 1 tablet (10 mg total) by mouth every 3 (three) hours as needed for severe pain ((score 7 to 10)).   PRESERVISION AREDS 2 PO Take 1 tablet by mouth in the morning and at bedtime.   ranolazine 500 MG 12 hr tablet Commonly known as: RANEXA Take 500 mg by mouth 2 (two) times daily.   sildenafil 20 MG tablet Commonly known as: REVATIO Take 2-5 tablets by mouth as needed (sexual disfunction).   tiZANidine 4 MG tablet Commonly known as: ZANAFLEX Take 4 mg by mouth every 8 (eight) hours as needed for muscle spasms. What changed: Another medication with the same name was added. Make sure you understand how and when to take each.   tiZANidine 4 MG tablet Commonly known as: ZANAFLEX Take 1 tablet (4 mg total) by mouth every 8 (eight) hours as needed for  muscle spasms. What changed: You were already taking a medication with the same name, and this prescription was added. Make sure you understand how and when to take each.   traZODone 50 MG tablet Commonly known as: DESYREL Take 100 mg by mouth at bedtime. Prn sleep               Durable Medical Equipment  (From admission, onward)           Start     Ordered   08/16/22 1657  DME Walker rolling  Once       Question:  Patient needs a walker to treat with the following condition  Answer:  S/P lumbar fusion   08/16/22 1656    08/16/22 1657  DME 3 n 1  Once        08/16/22 1656             Signed: Mallie Mussel A Loney Peto 08/17/2022, 9:36 AM

## 2022-08-17 NOTE — Care Management (Signed)
Patient to DC to home. DME provided by unit stock by staff, no HH needs identified.

## 2022-08-17 NOTE — Evaluation (Signed)
Physical Therapy Evaluation Patient Details Name: Adam Roach MRN: [id number] DOB: 1956-06-07 Today's Date: 08/17/2022  History of Present Illness  66 yo male admitted 11/17 for L2-S1 lami and PLIF. PMhx: renal CA, CAD, T2DM, GERD, HLD, HTN  Clinical Impression  Pt pleasant, lives at home alone and enjoys riding his [place]. Pt will stay with girlfriend in the short term at D/C. Pt also assists mom who has dementia with meals. Pt educated for brace wear, precautions, transfers, gait and function with pt able to verbalize understanding. Pt with decreased activity tolerance who will benefit from acute therapy to maximize mobility and independence. Activity appropriate for return home.        Recommendations for follow up therapy are one component of a multi-disciplinary discharge planning process, led by the attending physician.  Recommendations may be updated based on patient status, additional functional criteria and insurance authorization.  Follow Up Recommendations No PT follow up      Assistance Recommended at Discharge PRN  Patient can return home with the following  Assistance with cooking/housework;Assist for transportation    Equipment Recommendations Rolling walker (2 wheels);BSC/3in1  Recommendations for Other Services       Functional Status Assessment Patient has had a recent decline in their functional status and demonstrates the ability to make significant improvements in function in a reasonable and predictable amount of time.     Precautions / Restrictions Precautions Precautions: Back Precaution Booklet Issued: Yes (comment) Required Braces or Orthoses: Spinal Brace Spinal Brace: Lumbar corset;Applied in sitting position      Mobility  Bed Mobility Overal bed mobility: Needs Assistance Bed Mobility: Rolling, Sidelying to Sit Rolling: Supervision Sidelying to sit: Supervision       General bed mobility comments: supervision with cues for sequence     Transfers Overall transfer level: Modified independent                 General transfer comment: cues for posture and hand placement    Ambulation/Gait Ambulation/Gait assistance: Modified independent (Device/Increase time) Gait Distance (Feet): 400 Feet Assistive device: Rolling walker (2 wheels) Gait Pattern/deviations: Step-through pattern, Decreased stride length   Gait velocity interpretation: 1.31 - 2.62 ft/sec, indicative of limited community ambulator      Stairs Stairs: Yes Stairs assistance: Modified independent (Device/Increase time) Stair Management: One rail Left, Alternating pattern, Forwards Number of Stairs: 4    Wheelchair Mobility    Modified Rankin (Stroke Patients Only)       Balance Overall balance assessment: No apparent balance deficits (not formally assessed)                                           Pertinent Vitals/Pain Pain Assessment Pain Assessment: 0-10 Pain Score: 3  Pain Location: left leg and incision Pain Descriptors / Indicators: Numbness Pain Intervention(s): Limited activity within patient's tolerance, Monitored during session, Repositioned    Home Living Family/patient expects to be discharged to:: Private residence Living Arrangements: Spouse/significant other Available Help at Discharge: Friend(s);Available 24 hours/day Type of Home: House Home Access: Stairs to enter   [employer] of Steps: 1   Home Layout: One level Home Equipment: Cane - single point Additional Comments: pt lives alone and has low toilet and 4 stairs to enter. Plans to stay with girlfriend intially    Prior Function Prior Level of Function : Independent/Modified Independent  Hand Dominance        Extremity/Trunk Assessment   Upper Extremity Assessment Upper Extremity Assessment: Overall WFL for tasks assessed    Lower Extremity Assessment Lower Extremity Assessment:  Overall WFL for tasks assessed    Cervical / Trunk Assessment Cervical / Trunk Assessment: Back Surgery  Communication   Communication: No difficulties  Cognition Arousal/Alertness: Awake/alert Behavior During Therapy: WFL for tasks assessed/performed Overall Cognitive Status: Within Functional Limits for tasks assessed                                          General Comments      Exercises     Assessment/Plan    PT Assessment Patient needs continued PT services  PT Problem List Decreased mobility;Decreased activity tolerance;Decreased knowledge of precautions;Decreased knowledge of use of DME       PT Treatment Interventions Gait training;Functional mobility training;Patient/family education;Therapeutic activities;DME instruction    PT Goals (Current goals can be found in the Care Plan section)  Acute Rehab PT Goals Patient Stated Goal: return to riding my [place] PT Goal Formulation: With patient Time For Goal Achievement: 08/24/22 Potential to Achieve Goals: Good    Frequency Min 4X/week     Co-evaluation               AM-PAC PT "6 Clicks" Mobility  Outcome Measure Help needed turning from your back to your side while in a flat bed without using bedrails?: None Help needed moving from lying on your back to sitting on the side of a flat bed without using bedrails?: None Help needed moving to and from a bed to a chair (including a wheelchair)?: A Little Help needed standing up from a chair using your arms (e.g., wheelchair or bedside chair)?: None Help needed to walk in hospital room?: A Little Help needed climbing 3-5 steps with a railing? : A Little 6 Click Score: 21    End of Session Equipment Utilized During Treatment: Back brace Activity Tolerance: Patient tolerated treatment well Patient left: in chair;with call bell/phone within reach Nurse Communication: Mobility status PT Visit Diagnosis: Other abnormalities of gait and mobility  (R26.89)    Time: 2774-1287 PT Time Calculation (min) (ACUTE ONLY): 27 min   Charges:   PT Evaluation $PT Eval Low Complexity: 1 Low PT Treatments $Therapeutic Activity: 8-22 mins        Bayard Males, PT Acute Rehabilitation Services Office: [place] [facility] Jalea Bronaugh 08/17/2022, 7:58 AM

## 2022-08-17 NOTE — Evaluation (Signed)
Occupational Therapy Evaluation Patient Details Name: Adam Roach MRN: [id number] DOB: 05/21/1956 Today's Date: 08/17/2022   History of Present Illness 66 yo male admitted 11/17 for L2-S1 lami and PLIF. PMhx: renal CA, CAD, T2DM, GERD, HLD, HTN   Clinical Impression   Patient admitted for the procedure above.  PTA he lives alone, but will be staying with his SO for a week prior to returning home to his house.  Patient demonstrates good understanding of all precautions, able to complete in room mobility and lower body ADL without assist.  Car transfer and shower transfer discussed.  All questions answered, and no further OT needs in the acute setting.  Recommend follow up as prescribed by MD.       Recommendations for follow up therapy are one component of a multi-disciplinary discharge planning process, led by the attending physician.  Recommendations may be updated based on patient status, additional functional criteria and insurance authorization.   Follow Up Recommendations  No OT follow up     Assistance Recommended at Discharge Set up Supervision/Assistance  Patient can return home with the following Assist for transportation;Assistance with cooking/housework    Functional Status Assessment  Patient has had a recent decline in their functional status and demonstrates the ability to make significant improvements in function in a reasonable and predictable amount of time.  Equipment Recommendations  BSC/3in1    Recommendations for Other Services       Precautions / Restrictions Precautions Precautions: Back Precaution Booklet Issued: Yes (comment) Required Braces or Orthoses: Spinal Brace Spinal Brace: Lumbar corset;Applied in sitting position Restrictions Weight Bearing Restrictions: No      Mobility Bed Mobility               General bed mobility comments: up in recliner    Transfers Overall transfer level: Modified independent Equipment used: Rolling  walker (2 wheels)                      Balance Overall balance assessment: No apparent balance deficits (not formally assessed)                                         ADL either performed or assessed with clinical judgement   ADL Overall ADL's : At baseline                                       General ADL Comments: Able to figure four and used LH Reacher for lower body dressing     Vision Baseline Vision/History: 1 Wears glasses Patient Visual Report: No change from baseline       Perception     Praxis      Pertinent Vitals/Pain Pain Assessment Pain Assessment: Faces Faces Pain Scale: Hurts little more Pain Location: left leg and incision Pain Descriptors / Indicators: Aching, Grimacing     Hand Dominance Right   Extremity/Trunk Assessment Upper Extremity Assessment Upper Extremity Assessment: Overall WFL for tasks assessed   Lower Extremity Assessment Lower Extremity Assessment: Defer to PT evaluation   Cervical / Trunk Assessment Cervical / Trunk Assessment: Back Surgery   Communication Communication Communication: No difficulties   Cognition Arousal/Alertness: Awake/alert Behavior During Therapy: WFL for tasks assessed/performed Overall Cognitive Status: Within Functional Limits for tasks assessed  General Comments   VSS on RA    Exercises     Shoulder Instructions      Home Living Family/patient expects to be discharged to:: Private residence Living Arrangements: Spouse/significant other Available Help at Discharge: Friend(s);Available 24 hours/day Type of Home: House Home Access: Stairs to enter [employer] of Steps: 1   Home Layout: One level     Bathroom Shower/Tub: Teacher, early years/pre: Standard     Home Equipment: Cane - single point;Shower seat;Adaptive equipment;Rolling Walker (2 wheels);BSC/3in1 Adaptive  Equipment: Reacher Additional Comments: pt lives alone and has low toilet and 4 stairs to enter. Plans to stay with girlfriend intially      Prior Functioning/Environment Prior Level of Function : Independent/Modified Independent;Driving                        OT Problem List: Pain      OT Treatment/Interventions:      OT Goals(Current goals can be found in the care plan section) Acute Rehab OT Goals Patient Stated Goal: Return home today OT Goal Formulation: With patient Time For Goal Achievement: 08/19/22 Potential to Achieve Goals: Good  OT Frequency:      Co-evaluation              AM-PAC OT "6 Clicks" Daily Activity     Outcome Measure Help from another person eating meals?: None Help from another person taking care of personal grooming?: None Help from another person toileting, which includes using toliet, bedpan, or urinal?: None Help from another person bathing (including washing, rinsing, drying)?: A Little Help from another person to put on and taking off regular upper body clothing?: None Help from another person to put on and taking off regular lower body clothing?: A Little 6 Click Score: 22   End of Session Equipment Utilized During Treatment: Rolling walker (2 wheels) Nurse Communication: Mobility status  Activity Tolerance: Patient tolerated treatment well Patient left: in chair;with call bell/phone within reach  OT Visit Diagnosis: Pain Pain - part of body:  (lumbar)                Time: 7096-2836 OT Time Calculation (min): 22 min Charges:  OT General Charges $OT Visit: 1 Visit OT Evaluation $OT Eval Moderate Complexity: 1 Mod  08/17/2022  RP, OTR/L  Acute Rehabilitation Services  Office:  [phone]   Metta Clines 08/17/2022, 8:41 AM

## 2022-08-19 MED FILL — Heparin Sodium (Porcine) Inj 1000 Unit/ML: INTRAMUSCULAR | Qty: 30 | Status: AC

## 2022-08-19 MED FILL — Sodium Chloride Irrigation Soln 0.9%: Qty: 3000 | Status: AC

## 2022-08-19 MED FILL — Sodium Chloride IV Soln 0.9%: INTRAVENOUS | Qty: 1000 | Status: AC

## 2022-08-28 ENCOUNTER — Encounter (HOSPITAL_COMMUNITY): Payer: Self-pay | Admitting: Neurological Surgery

## 2022-10-29 DIAGNOSIS — L659 Nonscarring hair loss, unspecified: Secondary | ICD-10-CM | POA: Insufficient documentation

## 2022-10-30 ENCOUNTER — Other Ambulatory Visit (HOSPITAL_COMMUNITY): Payer: Self-pay | Admitting: Neurological Surgery

## 2022-10-30 ENCOUNTER — Ambulatory Visit (HOSPITAL_COMMUNITY)
Admission: RE | Admit: 2022-10-30 | Discharge: 2022-10-30 | Disposition: A | Discharge status: Home / Self Care | Payer: Medicare Other | Source: Ambulatory Visit | Attending: Neurological Surgery | Admitting: Neurological Surgery

## 2022-10-30 DIAGNOSIS — R609 Edema, unspecified: Secondary | ICD-10-CM | POA: Insufficient documentation

## 2022-10-30 NOTE — Progress Notes (Signed)
Right lower extremity venous duplex has been completed. Preliminary results can be found in CV Proc through chart review.  Results were given to [facility] at Dr. Ronnald Ramp' office.  10/30/22 10:26 AM Carlos Levering RVT

## 2022-11-05 ENCOUNTER — Encounter: Payer: Self-pay | Admitting: Internal Medicine

## 2022-11-05 ENCOUNTER — Other Ambulatory Visit: Payer: Self-pay

## 2022-11-05 ENCOUNTER — Ambulatory Visit: Payer: Medicare Other | Admitting: Internal Medicine

## 2022-11-05 VITALS — BP 137/82 | HR 57 | Temp 97.9°F | Resp 16 | Ht 65.0 in | Wt 179.0 lb

## 2022-11-05 DIAGNOSIS — Z23 Encounter for immunization: Secondary | ICD-10-CM

## 2022-11-05 DIAGNOSIS — M4647 Discitis, unspecified, lumbosacral region: Secondary | ICD-10-CM | POA: Diagnosis not present

## 2022-11-05 MED ORDER — LINEZOLID 600 MG PO TABS: 600.0000 mg | ORAL_TABLET | Freq: Two times a day (BID) | ORAL | 0 refills | Status: DC

## 2022-11-05 MED ORDER — SULFAMETHOXAZOLE-TRIMETHOPRIM 800-160 MG PO TABS: 1.0000 | ORAL_TABLET | Freq: Two times a day (BID) | ORAL | 0 refills | Status: DC

## 2022-11-05 NOTE — Progress Notes (Signed)
[place] for Infectious Disease      Reason for Consult: radiculopathy    Referring Physician: Dr. Ronnald Ramp    Patient ID: Adam Roach, male    DOB: [birth year], 67 y.o.   MRN: [id number]  HPI:   Mr Todorov is here for evaluation for concern for discitis. He underwent 4 level posterior lumbar interbody fusion by Dr. Ronnald Ramp about 3 months ago and has had continued left sided pain that radiates down his left buttock and down his left leg.  He underwent an MRI which noted new marrow edema L5-S1 with surrounding paraspinal edema, infection vs inflammatory.  He reports the pain is similar to what he had prior to surgery.  He has tried steroids with no benefit.  Pain is not exacerbated by anything, just constant.  CRP 25 and ESR 40.    Past Medical History:  Diagnosis Date   Arthritis    Cancer [facility]) 2012   Right clear cell renal cancer, s/p right partial nephrectomy 08/17/11   Coronary artery disease    inferior MI, s/p RCA stent 2002; DES ostimal ramus 08/19/19   Diabetes mellitus without complication ([place])    Type 2   Dysrhythmia    afib, s/p ablation 09/14/20, no known recurrence (08/08/22)   GERD (gastroesophageal reflux disease)    Hyperlipemia    Hypertension    Myocardial infarction [facility]) 2002   RCA stent [facility])   Wears glasses     Prior to Admission medications   Medication Sig Start Date End Date Taking? Authorizing Provider  aspirin 81 MG tablet Take 81 mg by mouth daily.   Yes [provider]  atenolol (TENORMIN) 50 MG tablet Take 50 mg by mouth every evening.   Yes [provider]  atorvastatin (LIPITOR) 80 MG tablet Take 80 mg by mouth daily.   Yes [provider]  HYDROcodone-acetaminophen (NORCO/VICODIN) 5-325 MG tablet Take 1 tablet by mouth every 6 (six) hours as needed for moderate pain. 08/06/22  Yes [provider]  LEVEMIR FLEXPEN 100 UNIT/ML FlexPen Inject 16 Units into the skin every evening. 12/05/15   Yes [provider]  lisinopril (ZESTRIL) 10 MG tablet Take 10 mg by mouth daily.   Yes [provider]  METAMUCIL FIBER PO Take 1 Dose by mouth every evening.   Yes [provider]  Multiple Vitamins-Minerals (PRESERVISION AREDS 2 PO) Take 1 tablet by mouth in the morning and at bedtime.   Yes [provider]  nitroGLYCERIN (NITROSTAT) 0.4 MG SL tablet Place 0.4 mg under the tongue every 5 (five) minutes as needed for chest pain.   Yes [provider]  omeprazole (PRILOSEC) 40 MG capsule Take 40 mg by mouth in the morning and at bedtime.   Yes [provider]  ranolazine (RANEXA) 500 MG 12 hr tablet Take 500 mg by mouth 2 (two) times daily.   Yes [provider]  sildenafil (REVATIO) 20 MG tablet Take 2-5 tablets by mouth as needed (sexual disfunction). 05/15/22  Yes [provider]  traZODone (DESYREL) 50 MG tablet Take 100 mg by mouth at bedtime. Prn sleep 08/25/15  Yes [provider]  oxyCODONE 10 MG TABS Take 1 tablet (10 mg total) by mouth every 3 (three) hours as needed for severe pain ((score 7 to 10)). Patient not taking: Reported on 11/05/2022 08/17/22   Earnie Larsson, MD  tiZANidine (ZANAFLEX) 4 MG tablet Take 4 mg by mouth every 8 (eight) hours as needed  for muscle spasms. Patient not taking: Reported on 11/05/2022    [provider]  tiZANidine (ZANAFLEX) 4 MG tablet Take 1 tablet (4 mg total) by mouth every 8 (eight) hours as needed for muscle spasms. Patient not taking: Reported on 11/05/2022 08/17/22   Earnie Larsson, MD    Allergies  Allergen Reactions   Ezetimibe Shortness Of Breath    Chest pain and breathing issues   Penicillins Anaphylaxis   Vancomycin Rash and Anaphylaxis   Ciprofloxacin In D5w Rash   Aspirin Other (See Comments)    At full strength, stomach irritation   Contrast Media [Iodinated Contrast Media] Hives   Ibuprofen Other (See Comments)    Stomach irritation    Ioxaglate  Hives   Ciprofloxacin Rash   Evolocumab Hives and Rash   Gabapentin Hives and Rash    Social History   Tobacco Use   Smoking status: Never   Smokeless tobacco: Never  Vaping Use   Vaping Use: Never used  Substance Use Topics   Alcohol use: No    Comment: last 2013   Drug use: No    Comment: used to use marajuana-last 2012    Family History  Problem Relation Age of Onset   Heart disease Father     Review of Systems  Constitutional: negative for fevers and chills All other systems reviewed and are negative    Constitutional: in no apparent distress There were no vitals filed for this visit. EYES: anicteric Respiratory: normal respiratory effort Musculoskeletal: no edema  Labs: Lab Results  Component Value Date   WBC 6.4 08/07/2022   HGB 13.3 08/07/2022   HCT 39.7 08/07/2022   MCV 89.2 08/07/2022   PLT 198 08/07/2022    Lab Results  Component Value Date   CREATININE 0.77 08/07/2022   BUN 14 08/07/2022   NA 136 08/07/2022   K 5.0 08/07/2022   CL 103 08/07/2022   CO2 27 08/07/2022    Lab Results  Component Value Date   INR 1.0 08/07/2022     Assessment: possible discitis.  MRI and labs reviewed.  Really no definitive way to know one way or the other.  It does not seem to have helped with steroids or other conservative measures.  I discussed the options with him with watchful waiting or a trial of antibiotics to see if there is any improvement and has opted to start antibiotics, which I think is a reasonable approach.  I discussed that I would anticipate some type of improvement after 2 weeks or so if it is due to infection.   He has mutliple allergies including throat swelling on penicillin  Plan: 1)  linezolid + Bactrim (for GNR coverage) for 2 weeks 2) follow up with me in two weeks to reassess clinically and with repeat ESR, CRP along with CMP, cbc on above antibiotics.

## 2022-11-06 LAB — C-REACTIVE PROTEIN: CRP: 3.7 mg/L (ref <8.0)

## 2022-11-06 LAB — SEDIMENTATION RATE: Sed Rate: 2 mm/h (ref 0–20)

## 2022-11-20 ENCOUNTER — Ambulatory Visit: Payer: Medicare Other | Admitting: Internal Medicine

## 2022-11-20 ENCOUNTER — Encounter: Payer: Self-pay | Admitting: Internal Medicine

## 2022-11-20 ENCOUNTER — Other Ambulatory Visit: Payer: Self-pay

## 2022-11-20 VITALS — BP 128/78 | HR 55 | Temp 97.8°F | Ht 65.0 in | Wt 143.0 lb

## 2022-11-20 DIAGNOSIS — M4647 Discitis, unspecified, lumbosacral region: Secondary | ICD-10-CM

## 2022-11-20 MED ORDER — SULFAMETHOXAZOLE-TRIMETHOPRIM 800-160 MG PO TABS: 1.0000 | ORAL_TABLET | Freq: Two times a day (BID) | ORAL | 0 refills | Status: DC

## 2022-11-20 MED ORDER — LINEZOLID 600 MG PO TABS: 600.0000 mg | ORAL_TABLET | Freq: Two times a day (BID) | ORAL | 0 refills | Status: DC

## 2022-11-20 NOTE — Assessment & Plan Note (Signed)
He seems to be doing much better now about 2 weeks after starting antibiotics.  No issues with the treatment.  At this point, since he is improving, will have him continue on treatment and follow up in about 2 weeks.  Will check inflammatory markers and cbc, cmp on treatment.    I have personally spent 30 minutes involved in face-to-face and non-face-to-face activities for this patient on the day of the visit. Professional time spent includes the following activities: Preparing to see the patient (review of tests), Obtaining and/or reviewing separately obtained history (admission/discharge record), Performing a medically appropriate examination and/or evaluation , Ordering medications/tests/procedures, referring and communicating with other health care professionals, Documenting clinical information in the EMR, Independently interpreting results (not separately reported), Communicating results to the patient/family/caregiver, Counseling and educating the patient/family/caregiver and Care coordination (not separately reported).

## 2022-11-20 NOTE — Progress Notes (Signed)
   Subjective:    Patient ID: Adam Roach, male    DOB: July 06, 1956, 67 y.o.   MRN: [id number]  HPI Here for follow up of possible discitis. He underwent 4 level posterior lumbar interbody fusion by Dr. Ronnald Ramp in November 2023 and had continued pain after surgery.  MRI notable for inflammatory reaction and unclear if infection involved.  Steroids did not help.  CRP and ESR not significantly elevated. I started him on empiric antibiotics to gauge effect if potential infection involved.  Since starting 2 weeks ago, he has noted some significant improvement in his back pain for the first time.  No issues with antibiotics, tolerating well.     Review of Systems  Constitutional:  Negative for fatigue.  Gastrointestinal:  Negative for diarrhea and nausea.  Skin:  Negative for rash.       Objective:   Physical Exam Eyes:     General: No scleral icterus. Pulmonary:     Effort: Pulmonary effort is normal.  Neurological:     Mental Status: He is alert.    SH: no tobacco       Assessment & Plan:

## 2022-11-21 LAB — CBC WITH DIFFERENTIAL/PLATELET
Absolute Monocytes: 510 cells/uL (ref 200–950)
Basophils Absolute: 48 cells/uL (ref 0–200)
Basophils Relative: 0.8 %
Eosinophils Absolute: 282 cells/uL (ref 15–500)
Eosinophils Relative: 4.7 %
HCT: 37.7 % — ABNORMAL LOW (ref 38.5–50.0)
Hemoglobin: 12.1 g/dL — ABNORMAL LOW (ref 13.2–17.1)
Lymphs Abs: 1716 cells/uL (ref 850–3900)
MCH: 25.5 pg — ABNORMAL LOW (ref 27.0–33.0)
MCHC: 32.1 g/dL (ref 32.0–36.0)
MCV: 79.5 fL — ABNORMAL LOW (ref 80.0–100.0)
MPV: 11.8 fL (ref 7.5–12.5)
Monocytes Relative: 8.5 %
Neutro Abs: 3444 cells/uL (ref 1500–7800)
Neutrophils Relative %: 57.4 %
Platelets: 123 10*3/uL — ABNORMAL LOW (ref 140–400)
RBC: 4.74 10*6/uL (ref 4.20–5.80)
RDW: 14.8 % (ref 11.0–15.0)
Total Lymphocyte: 28.6 %
WBC: 6 10*3/uL (ref 3.8–10.8)

## 2022-11-21 LAB — COMPREHENSIVE METABOLIC PANEL
AG Ratio: 2 (calc) (ref 1.0–2.5)
ALT: 23 U/L (ref 9–46)
AST: 15 U/L (ref 10–35)
Albumin: 4.3 g/dL (ref 3.6–5.1)
Alkaline phosphatase (APISO): 71 U/L (ref 35–144)
BUN: 16 mg/dL (ref 7–25)
CO2: 28 mmol/L (ref 20–32)
Calcium: 8.8 mg/dL (ref 8.6–10.3)
Chloride: 98 mmol/L (ref 98–110)
Creat: 0.79 mg/dL (ref 0.70–1.35)
Globulin: 2.2 g/dL (calc) (ref 1.9–3.7)
Glucose, Bld: 148 mg/dL — ABNORMAL HIGH (ref 65–99)
Potassium: 4.6 mmol/L (ref 3.5–5.3)
Sodium: 132 mmol/L — ABNORMAL LOW (ref 135–146)
Total Bilirubin: 0.4 mg/dL (ref 0.2–1.2)
Total Protein: 6.5 g/dL (ref 6.1–8.1)

## 2022-11-21 LAB — SEDIMENTATION RATE: Sed Rate: 2 mm/h (ref 0–20)

## 2022-11-21 LAB — C-REACTIVE PROTEIN: CRP: 1.2 mg/L (ref <8.0)

## 2022-12-03 ENCOUNTER — Ambulatory Visit: Payer: Medicare Other | Admitting: Internal Medicine

## 2022-12-03 ENCOUNTER — Other Ambulatory Visit: Payer: Self-pay

## 2022-12-03 ENCOUNTER — Encounter: Payer: Self-pay | Admitting: Internal Medicine

## 2022-12-03 VITALS — BP 91/59 | HR 56 | Temp 98.0°F | Wt 169.0 lb

## 2022-12-03 DIAGNOSIS — M25552 Pain in left hip: Secondary | ICD-10-CM | POA: Diagnosis not present

## 2022-12-03 DIAGNOSIS — M25551 Pain in right hip: Secondary | ICD-10-CM

## 2022-12-03 DIAGNOSIS — Z5181 Encounter for therapeutic drug level monitoring: Secondary | ICD-10-CM | POA: Diagnosis not present

## 2022-12-03 DIAGNOSIS — M4647 Discitis, unspecified, lumbosacral region: Secondary | ICD-10-CM | POA: Diagnosis not present

## 2022-12-03 NOTE — Assessment & Plan Note (Signed)
Will check his labs today Tolerating well

## 2022-12-03 NOTE — Assessment & Plan Note (Signed)
New hip pain bilateral that limits his mobility.  I suspect it is related to improving movement.  He will further discuss this with Dr. Ronnald Ramp.

## 2022-12-03 NOTE — Assessment & Plan Note (Signed)
He continues to improve with less and less back pain and seems to be responding to the antibiotics.  At this point, I will have him complete 6 weeks then stop at that point.

## 2022-12-03 NOTE — Progress Notes (Signed)
   Subjective:    Patient ID: Adam Roach, male    DOB: 02/02/1956, 66 y.o.   MRN: [id number]  HPI Here for follow up of possible discitis. He underwent 4 level posterior lumbar interbody fusion by Dr. Jones in November 2023 and had continued pain after surgery.  MRI notable for inflammatory reaction and unclear if infection involved.  Steroids did not help.  CRP and ESR not significantly elevated. I started him on empiric antibiotics to gauge effect if potential infection involved.  Since starting 2 weeks ago, he has noted some significant improvement in his back pain for the first time.  No issues with antibiotics, tolerating well.     Review of Systems  Constitutional:  Negative for fatigue.  Gastrointestinal:  Negative for diarrhea and nausea.  Skin:  Negative for rash.       Objective:   Physical Exam Eyes:     General: No scleral icterus. Pulmonary:     Effort: Pulmonary effort is normal.  Neurological:     Mental Status: He is alert.    SH: no tobacco       Assessment & Plan:    

## 2022-12-04 LAB — COMPLETE METABOLIC PANEL WITH GFR
AG Ratio: 2 (calc) (ref 1.0–2.5)
ALT: 20 U/L (ref 9–46)
AST: 14 U/L (ref 10–35)
Albumin: 3.9 g/dL (ref 3.6–5.1)
Alkaline phosphatase (APISO): 60 U/L (ref 35–144)
BUN: 14 mg/dL (ref 7–25)
CO2: 25 mmol/L (ref 20–32)
Calcium: 8.8 mg/dL (ref 8.6–10.3)
Chloride: 101 mmol/L (ref 98–110)
Creat: 0.9 mg/dL (ref 0.70–1.35)
Globulin: 2 g/dL (calc) (ref 1.9–3.7)
Glucose, Bld: 111 mg/dL — ABNORMAL HIGH (ref 65–99)
Potassium: 4.6 mmol/L (ref 3.5–5.3)
Sodium: 133 mmol/L — ABNORMAL LOW (ref 135–146)
Total Bilirubin: 0.4 mg/dL (ref 0.2–1.2)
Total Protein: 5.9 g/dL — ABNORMAL LOW (ref 6.1–8.1)
eGFR: 94 mL/min/{1.73_m2} (ref >=60)

## 2022-12-04 LAB — CBC WITH DIFFERENTIAL/PLATELET
Absolute Monocytes: 320 cells/uL (ref 200–950)
Basophils Absolute: 28 cells/uL (ref 0–200)
Basophils Relative: 0.6 %
Eosinophils Absolute: 240 cells/uL (ref 15–500)
Eosinophils Relative: 5.1 %
HCT: 32.9 % — ABNORMAL LOW (ref 38.5–50.0)
Hemoglobin: 10.4 g/dL — ABNORMAL LOW (ref 13.2–17.1)
Lymphs Abs: 1335 cells/uL (ref 850–3900)
MCH: 25.4 pg — ABNORMAL LOW (ref 27.0–33.0)
MCHC: 31.6 g/dL — ABNORMAL LOW (ref 32.0–36.0)
MCV: 80.4 fL (ref 80.0–100.0)
MPV: 10.9 fL (ref 7.5–12.5)
Monocytes Relative: 6.8 %
Neutro Abs: 2778 cells/uL (ref 1500–7800)
Neutrophils Relative %: 59.1 %
Platelets: 169 10*3/uL (ref 140–400)
RBC: 4.09 10*6/uL — ABNORMAL LOW (ref 4.20–5.80)
RDW: 16 % — ABNORMAL HIGH (ref 11.0–15.0)
Total Lymphocyte: 28.4 %
WBC: 4.7 10*3/uL (ref 3.8–10.8)

## 2022-12-17 ENCOUNTER — Encounter: Payer: Self-pay | Admitting: Internal Medicine

## 2022-12-17 ENCOUNTER — Ambulatory Visit: Payer: Medicare Other | Admitting: Internal Medicine

## 2022-12-17 ENCOUNTER — Other Ambulatory Visit: Payer: Self-pay

## 2022-12-17 VITALS — BP 134/80 | HR 55 | Temp 98.0°F | Wt 168.8 lb

## 2022-12-17 DIAGNOSIS — M4647 Discitis, unspecified, lumbosacral region: Secondary | ICD-10-CM

## 2022-12-17 DIAGNOSIS — M25552 Pain in left hip: Secondary | ICD-10-CM | POA: Diagnosis not present

## 2022-12-17 DIAGNOSIS — M25551 Pain in right hip: Secondary | ICD-10-CM | POA: Diagnosis not present

## 2022-12-17 DIAGNOSIS — Z5181 Encounter for therapeutic drug level monitoring: Secondary | ICD-10-CM

## 2022-12-17 NOTE — Assessment & Plan Note (Signed)
He continues to complain of bilateral hip pain.  He does have an appointment with Dr. Ronnald Ramp coming up and will discuss if related to his spine.   Otherwise can follow up with his PCP

## 2022-12-17 NOTE — Progress Notes (Signed)
   Subjective:    Patient ID: Adam Roach, male    DOB: 01-17-1956, 67 y.o.   MRN: [id number]  HPI Here for follow up of possible discitis.  He underwent 4 level posterior lumbar interbody fusion by Dr. Ronnald Ramp in November 2023 and had continued pain after surgery.  MRI notable for inflammatory reaction and unclear if infection involved.  Steroids did not help.  CRP and ESR not significantly elevated. I started him on empiric antibiotics to gauge effect if potential infection involved.  He felt much better after 2 weeks of antibiotics and tolerating well so I had him continue.  He continued to improve and now has completed 6 weeks of empiric treatment.  No complaints today.    Review of Systems  Constitutional:  Negative for activity change and fatigue.  Gastrointestinal:  Negative for diarrhea and nausea.  Skin:  Negative for rash.       Objective:   Physical Exam Eyes:     General: No scleral icterus. Pulmonary:     Effort: Pulmonary effort is normal.  Neurological:     Mental Status: He is alert.    SH: no tobacco       Assessment & Plan:

## 2022-12-17 NOTE — Assessment & Plan Note (Signed)
Much improved and now off of antibiotics.  At this point, will observe off treatment.  Will check ESR and CRP Follow up as needed

## 2022-12-17 NOTE — Assessment & Plan Note (Signed)
Will recheck his cmp and cbc on linezolid

## 2022-12-18 LAB — CBC WITH DIFFERENTIAL/PLATELET
Absolute Monocytes: 485 cells/uL (ref 200–950)
Basophils Absolute: 40 cells/uL (ref 0–200)
Basophils Relative: 0.8 %
Eosinophils Absolute: 210 cells/uL (ref 15–500)
Eosinophils Relative: 4.2 %
HCT: 33.7 % — ABNORMAL LOW (ref 38.5–50.0)
Hemoglobin: 10.4 g/dL — ABNORMAL LOW (ref 13.2–17.1)
Lymphs Abs: 1120 cells/uL (ref 850–3900)
MCH: 24.6 pg — ABNORMAL LOW (ref 27.0–33.0)
MCHC: 30.9 g/dL — ABNORMAL LOW (ref 32.0–36.0)
MCV: 79.9 fL — ABNORMAL LOW (ref 80.0–100.0)
MPV: 12.3 fL (ref 7.5–12.5)
Monocytes Relative: 9.7 %
Neutro Abs: 3145 cells/uL (ref 1500–7800)
Neutrophils Relative %: 62.9 %
Platelets: 220 10*3/uL (ref 140–400)
RBC: 4.22 10*6/uL (ref 4.20–5.80)
RDW: 16.7 % — ABNORMAL HIGH (ref 11.0–15.0)
Total Lymphocyte: 22.4 %
WBC: 5 10*3/uL (ref 3.8–10.8)

## 2022-12-18 LAB — COMPLETE METABOLIC PANEL WITH GFR
AG Ratio: 2.2 (calc) (ref 1.0–2.5)
ALT: 14 U/L (ref 9–46)
AST: 12 U/L (ref 10–35)
Albumin: 4.1 g/dL (ref 3.6–5.1)
Alkaline phosphatase (APISO): 55 U/L (ref 35–144)
BUN: 12 mg/dL (ref 7–25)
CO2: 29 mmol/L (ref 20–32)
Calcium: 8.9 mg/dL (ref 8.6–10.3)
Chloride: 100 mmol/L (ref 98–110)
Creat: 0.76 mg/dL (ref 0.70–1.35)
Globulin: 1.9 g/dL (calc) (ref 1.9–3.7)
Glucose, Bld: 196 mg/dL — ABNORMAL HIGH (ref 65–99)
Potassium: 4.5 mmol/L (ref 3.5–5.3)
Sodium: 135 mmol/L (ref 135–146)
Total Bilirubin: 0.4 mg/dL (ref 0.2–1.2)
Total Protein: 6 g/dL — ABNORMAL LOW (ref 6.1–8.1)
eGFR: 99 mL/min/{1.73_m2} (ref >=60)

## 2022-12-18 LAB — C-REACTIVE PROTEIN: CRP: 2.7 mg/L (ref <8.0)

## 2022-12-18 LAB — SEDIMENTATION RATE: Sed Rate: 2 mm/h (ref 0–20)

## 2023-01-16 ENCOUNTER — Other Ambulatory Visit (HOSPITAL_BASED_OUTPATIENT_CLINIC_OR_DEPARTMENT_OTHER): Payer: Self-pay | Admitting: Neurological Surgery

## 2023-01-16 DIAGNOSIS — M5416 Radiculopathy, lumbar region: Secondary | ICD-10-CM

## 2023-01-17 ENCOUNTER — Ambulatory Visit (HOSPITAL_BASED_OUTPATIENT_CLINIC_OR_DEPARTMENT_OTHER)
Admission: RE | Admit: 2023-01-17 | Discharge: 2023-01-17 | Disposition: A | Discharge status: Home / Self Care | Payer: Medicare Other | Source: Ambulatory Visit | Attending: Neurological Surgery | Admitting: Neurological Surgery

## 2023-01-17 DIAGNOSIS — M5416 Radiculopathy, lumbar region: Secondary | ICD-10-CM | POA: Diagnosis present

## 2023-03-07 ENCOUNTER — Other Ambulatory Visit: Payer: Self-pay | Admitting: Neurosurgery

## 2023-03-28 DIAGNOSIS — E7800 Pure hypercholesterolemia, unspecified: Secondary | ICD-10-CM | POA: Insufficient documentation

## 2023-03-28 DIAGNOSIS — I10 Essential (primary) hypertension: Secondary | ICD-10-CM | POA: Insufficient documentation

## 2023-03-31 ENCOUNTER — Other Ambulatory Visit: Payer: Self-pay

## 2023-03-31 NOTE — Pre-Procedure Instructions (Signed)
Surgical Instructions    Your procedure is scheduled on April 14, 2023.  Report to [facility] Main Entrance "A" at 5:30 A.M., then check in with the Admitting office.  Call this number if you have problems the morning of surgery:  [phone]  If you have any questions prior to your surgery date call [phone]: Open Monday-Friday 8am-4pm If you experience any cold or flu symptoms such as cough, fever, chills, shortness of breath, etc. between now and your scheduled surgery, please notify us at the above number.     Remember:  Do not eat or drink after midnight the night before your surgery     Take these medicines the morning of surgery with A SIP OF WATER:  atorvastatin (LIPITOR)   omeprazole (PRILOSEC)   ranolazine (RANEXA)     May take these medicines IF NEEDED:  HYDROcodone-acetaminophen (NORCO/VICODIN)   nitroGLYCERIN (NITROSTAT) - please call [phone] if medicine taken prior to surgery.  pregabalin (LYRICA)     Follow your surgeon's instructions on when to stop Aspirin.  If no instructions were given by your surgeon then you will need to call the office to get those instructions.     As of today, STOP taking any Aleve, Naproxen, Ibuprofen, Motrin, Advil, Goody's, BC's, all herbal medications, fish oil, and all vitamins.   WHAT DO I DO ABOUT MY DIABETES MEDICATION?   THE NIGHT BEFORE SURGERY, take 5 units of LEVEMIR FLEXPEN insulin.       HOW TO MANAGE YOUR DIABETES BEFORE AND AFTER SURGERY  Why is it important to control my blood sugar before and after surgery? Improving blood sugar levels before and after surgery helps healing and can limit problems. A way of improving blood sugar control is eating a healthy diet by:  Eating less sugar and carbohydrates  Increasing activity/exercise  Talking with your doctor about reaching your blood sugar goals High blood sugars (greater than 180 mg/dL) can raise your risk of infections and slow your recovery, so you  will need to focus on controlling your diabetes during the weeks before surgery. Make sure that the doctor who takes care of your diabetes knows about your planned surgery including the date and location.  How do I manage my blood sugar before surgery? Check your blood sugar at least 4 times a day, starting 2 days before surgery, to make sure that the level is not too high or low.  Check your blood sugar the morning of your surgery when you wake up and every 2 hours until you get to the Short Stay unit.  If your blood sugar is less than 70 mg/dL, you will need to treat for low blood sugar: Do not take insulin. Treat a low blood sugar (less than 70 mg/dL) with  cup of clear juice (cranberry or apple), 4 glucose tablets, OR glucose gel. Recheck blood sugar in 15 minutes after treatment (to make sure it is greater than 70 mg/dL). If your blood sugar is not greater than 70 mg/dL on recheck, call [phone] for further instructions. Report your blood sugar to the short stay nurse when you get to Short Stay.  If you are admitted to the hospital after surgery: Your blood sugar will be checked by the staff and you will probably be given insulin after surgery (instead of oral diabetes medicines) to make sure you have good blood sugar levels. The goal for blood sugar control after surgery is 80-180 mg/dL.  Do NOT Smoke (Tobacco/Vaping) for 24 hours prior to your procedure.  If you use a CPAP at night, you may bring your mask/headgear for your overnight stay.   Contacts, glasses, piercing's, hearing aid's, dentures or partials may not be worn into surgery, please bring cases for these belongings.    For patients admitted to the hospital, discharge time will be determined by your treatment team.   Patients discharged the day of surgery will not be allowed to drive home, and someone needs to stay with them for 24 hours.  SURGICAL WAITING ROOM VISITATION Patients having  surgery or a procedure may have no more than 2 support people in the waiting area - these visitors may rotate.   Children under the age of 84 must have an adult with them who is not the patient. If the patient needs to stay at the hospital during part of their recovery, the visitor guidelines for inpatient rooms apply. Pre-op nurse will coordinate an appropriate time for 1 support person to accompany patient in pre-op.  This support person may not rotate.   Please refer to the [facility] website for the visitor guidelines for Inpatients (after your surgery is over and you are in a regular room).    If you received a COVID test during your pre-op visit  it is requested that you wear a mask when out in public, stay away from anyone that may not be feeling well and notify your surgeon if you develop symptoms. If you have been in contact with anyone that has tested positive in the last 10 days please notify you surgeon.     Pre-operative 5 CHG Bath Instructions   You can play a key role in reducing the risk of infection after surgery. Your skin needs to be as free of germs as possible. You can reduce the number of germs on your skin by washing with CHG (chlorhexidine gluconate) soap before surgery. CHG is an antiseptic soap that kills germs and continues to kill germs even after washing.   DO NOT use if you have an allergy to chlorhexidine/CHG or antibacterial soaps. If your skin becomes reddened or irritated, stop using the CHG and notify one of our RNs at [phone].   Please shower with the CHG soap starting 4 days before surgery using the following schedule:     Please keep in mind the following:  DO NOT shave, including legs and underarms, starting the day of your first shower.   You may shave your face at any point before/day of surgery.  Place clean sheets on your bed the day you start using CHG soap. Use a clean washcloth (not used since being washed) for each shower. DO NOT sleep  with pets once you start using the CHG.   CHG Shower Instructions:  If you choose to wash your hair and private area, wash first with your normal shampoo/soap.  After you use shampoo/soap, rinse your hair and body thoroughly to remove shampoo/soap residue.  Turn the water OFF and apply about 3 tablespoons (45 ml) of CHG soap to a CLEAN washcloth.  Apply CHG soap ONLY FROM YOUR NECK DOWN TO YOUR TOES (washing for 3-5 minutes)  DO NOT use CHG soap on face, private areas, open wounds, or sores.  Pay special attention to the area where your surgery is being performed.  If you are having back surgery, having someone wash your back for you may be helpful. Wait 2 minutes after CHG soap is applied, then you  may rinse off the CHG soap.  Pat dry with a clean towel  Put on clean clothes/pajamas   If you choose to wear lotion, please use ONLY the CHG-compatible lotions on the back of this paper.     Additional instructions for the day of surgery: DO NOT APPLY any lotions, deodorants, cologne, or perfumes.   Do not wear jewelry or makeup Do not wear nail polish, gel polish, artificial nails, or any other type of covering on natural nails (fingers and toes) Do not bring valuables to the hospital. [facility] is not responsible for any belongings or valuables. Put on clean/comfortable clothes.  Brush your teeth.  Ask your nurse before applying any prescription medications to the skin.      CHG Compatible Lotions   Aveeno Moisturizing lotion  Cetaphil Moisturizing Cream  Cetaphil Moisturizing Lotion  Clairol Herbal Essence Moisturizing Lotion, Dry Skin  Clairol Herbal Essence Moisturizing Lotion, Extra Dry Skin  Clairol Herbal Essence Moisturizing Lotion, Normal Skin  Curel Age Defying Therapeutic Moisturizing Lotion with Alpha Hydroxy  Curel Extreme Care Body Lotion  Curel Soothing Hands Moisturizing Hand Lotion  Curel Therapeutic Moisturizing Cream, Fragrance-Free  Curel Therapeutic  Moisturizing Lotion, Fragrance-Free  Curel Therapeutic Moisturizing Lotion, Original Formula  Eucerin Daily Replenishing Lotion  Eucerin Dry Skin Therapy Plus Alpha Hydroxy Crme  Eucerin Dry Skin Therapy Plus Alpha Hydroxy Lotion  Eucerin Original Crme  Eucerin Original Lotion  Eucerin Plus Crme Eucerin Plus Lotion  Eucerin TriLipid Replenishing Lotion  Keri Anti-Bacterial Hand Lotion  Keri Deep Conditioning Original Lotion Dry Skin Formula Softly Scented  Keri Deep Conditioning Original Lotion, Fragrance Free Sensitive Skin Formula  Keri Lotion Fast Absorbing Fragrance Free Sensitive Skin Formula  Keri Lotion Fast Absorbing Softly Scented Dry Skin Formula  Keri Original Lotion  Keri Skin Renewal Lotion Keri Silky Smooth Lotion  Keri Silky Smooth Sensitive Skin Lotion  Nivea Body Creamy Conditioning Oil  Nivea Body Extra Enriched Lotion  Nivea Body Original Lotion  Nivea Body Sheer Moisturizing Lotion Nivea Crme  Nivea Skin Firming Lotion  NutraDerm 30 Skin Lotion  NutraDerm Skin Lotion  NutraDerm Therapeutic Skin Cream  NutraDerm Therapeutic Skin Lotion  ProShield Protective Hand Cream  Provon moisturizing lotion   Please read over the following fact sheets that you were given.

## 2023-04-01 ENCOUNTER — Other Ambulatory Visit: Payer: Self-pay

## 2023-04-01 ENCOUNTER — Encounter (HOSPITAL_COMMUNITY): Payer: Self-pay

## 2023-04-01 ENCOUNTER — Encounter (HOSPITAL_COMMUNITY)
Admission: RE | Admit: 2023-04-01 | Discharge: 2023-04-01 | Disposition: A | Discharge status: Home / Self Care | Payer: Medicare Other | Source: Ambulatory Visit | Attending: Neurosurgery | Admitting: Neurosurgery

## 2023-04-01 VITALS — BP 154/68 | HR 74 | Temp 97.7°F | Resp 17 | Ht 65.0 in | Wt 165.0 lb

## 2023-04-01 DIAGNOSIS — I252 Old myocardial infarction: Secondary | ICD-10-CM | POA: Insufficient documentation

## 2023-04-01 DIAGNOSIS — Z01818 Encounter for other preprocedural examination: Secondary | ICD-10-CM | POA: Diagnosis not present

## 2023-04-01 DIAGNOSIS — Z905 Acquired absence of kidney: Secondary | ICD-10-CM | POA: Diagnosis not present

## 2023-04-01 DIAGNOSIS — I119 Hypertensive heart disease without heart failure: Secondary | ICD-10-CM | POA: Diagnosis not present

## 2023-04-01 DIAGNOSIS — Z955 Presence of coronary angioplasty implant and graft: Secondary | ICD-10-CM | POA: Insufficient documentation

## 2023-04-01 DIAGNOSIS — Z85528 Personal history of other malignant neoplasm of kidney: Secondary | ICD-10-CM | POA: Diagnosis not present

## 2023-04-01 DIAGNOSIS — I2510 Atherosclerotic heart disease of native coronary artery without angina pectoris: Secondary | ICD-10-CM | POA: Diagnosis not present

## 2023-04-01 DIAGNOSIS — E119 Type 2 diabetes mellitus without complications: Secondary | ICD-10-CM | POA: Insufficient documentation

## 2023-04-01 DIAGNOSIS — E785 Hyperlipidemia, unspecified: Secondary | ICD-10-CM | POA: Insufficient documentation

## 2023-04-01 LAB — BASIC METABOLIC PANEL
Anion gap: 7 (ref 5–15)
BUN: 11 mg/dL (ref 8–23)
CO2: 27 mmol/L (ref 22–32)
Calcium: 8.9 mg/dL (ref 8.9–10.3)
Chloride: 99 mmol/L (ref 98–111)
Creatinine, Ser: 0.78 mg/dL (ref 0.61–1.24)
GFR, Estimated: >60 mL/min (ref >60)
Glucose, Bld: 252 mg/dL — ABNORMAL HIGH (ref 70–99)
Potassium: 4.6 mmol/L (ref 3.5–5.1)
Sodium: 133 mmol/L — ABNORMAL LOW (ref 135–145)

## 2023-04-01 LAB — CBC
HCT: 42.6 % (ref 39.0–52.0)
Hemoglobin: 13.8 g/dL (ref 13.0–17.0)
MCH: 28.5 pg (ref 26.0–34.0)
MCHC: 32.4 g/dL (ref 30.0–36.0)
MCV: 87.8 fL (ref 80.0–100.0)
Platelets: 270 10*3/uL (ref 150–400)
RBC: 4.85 MIL/uL (ref 4.22–5.81)
RDW: 13.7 % (ref 11.5–15.5)
WBC: 6.2 10*3/uL (ref 4.0–10.5)
nRBC: 0 % (ref 0.0–0.2)

## 2023-04-01 LAB — SURGICAL PCR SCREEN
MRSA, PCR: NEGATIVE
Staphylococcus aureus: NEGATIVE

## 2023-04-01 LAB — HEMOGLOBIN A1C
Hgb A1c MFr Bld: 7.7 % — ABNORMAL HIGH (ref 4.8–5.6)
Mean Plasma Glucose: 174.29 mg/dL

## 2023-04-01 LAB — GLUCOSE, CAPILLARY: Glucose-Capillary: 363 mg/dL — ABNORMAL HIGH (ref 70–99)

## 2023-04-01 NOTE — Progress Notes (Signed)
PCP - Rushie Nyhan Cardiologist - Diamond Nickel- [facility]  PPM/ICD - denies Device Orders -  Rep Notified -   Chest x-ray - na EKG - 04/01/23 Stress Test - 12/17/21 ECHO - 08/09/21 Cardiac Cath - 08/19/19  Sleep Study - denies CPAP - no  Fasting Blood Sugar - 140's Checks Blood Sugar once_ times a day  Last dose of GLP1 agonist-  na GLP1 instructions: na  Blood Thinner Instructions:na Aspirin Instructions:pt states he was instructed to stop one week prior by Dr. Wynetta Emery.   ERAS Protcol - no PRE-SURGERY Ensure or G2-   COVID TEST- no   Anesthesia review: yes- cardiac history-no cardiac clearance  Patient denies shortness of breath, fever, cough and chest pain at PAT appointment   All instructions explained to the patient, with a verbal understanding of the material. Patient agrees to go over the instructions while at home for a better understanding. Patient also instructed to self quarantine after being tested for COVID-19. The opportunity to ask questions was provided.

## 2023-04-02 NOTE — Anesthesia Preprocedure Evaluation (Addendum)
Anesthesia Evaluation  Patient identified by MRN, date of birth, ID band Patient awake    Reviewed: Allergy & Precautions, H&P , NPO status , Patient's Chart, lab work & pertinent test results  Airway Mallampati: II   Neck ROM: full    Dental   Pulmonary neg pulmonary ROS   breath sounds clear to auscultation       Cardiovascular hypertension, + CAD, + Past MI and + Cardiac Stents  + dysrhythmias Atrial Fibrillation  Rhythm:regular Rate:Normal  S/p ablation   Neuro/Psych  Neuromuscular disease    GI/Hepatic ,GERD  ,,  Endo/Other  diabetes, Type 2    Renal/GU      Musculoskeletal  (+) Arthritis ,    Abdominal   Peds  Hematology   Anesthesia Other Findings   Reproductive/Obstetrics                             Anesthesia Physical Anesthesia Plan  ASA: 3  Anesthesia Plan: General   Post-op Pain Management:    Induction: Intravenous  PONV Risk Score and Plan: 2 and Ondansetron, Dexamethasone, Midazolam and Treatment may vary due to age or medical condition  Airway Management Planned: Oral ETT  Additional Equipment:   Intra-op Plan:   Post-operative Plan: Extubation in OR  Informed Consent: I have reviewed the patients History and Physical, chart, labs and discussed the procedure including the risks, benefits and alternatives for the proposed anesthesia with the patient or authorized representative who has indicated his/her understanding and acceptance.     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  Nuclear stress test 11/2021 showed fixed  inferior and inferior septal wall defect without reversible ischemia.  Echo 07/2021 showed EF 60 to 65%, no significant valvular abnormalities.  Follows with cardiologist Dr. Judithe Modest at [facility].  Last follow-up was on 01/28/2023 he was noted to be doing well at that time.  Per note, "PLAN: Mr. Schoffstall seems to be doing well from a cardiology standpoint. He denies any anginal sounding chest discomfort and seems to be stable and asymptomatic with his coronary disease previous MI and interventions. He is maintaining sinus rhythm without recurrence of the atrial fibrillation since the pulmonary vein isolation with cryoablation in 2021. He stopped anticoagulation last year at the recommendation of the electrophysiologist with no recurrent A-fib. He had his back surgery but he still struggling with a lot of back pain. He he is following up regularly with the neurosurgeon to address this. He continues on lipid-lowering therapy as managed by his primary care provider and his cholesterol checked recently look to be well-controlled. His blood pressure was elevated here in the office today but he is in a lot of pain with his back. I will not make any changes in his medical regimen today. I will defer to his primary care provider regarding this. I will see him back in 6 months "  Underwent L2-S1 lumbar fusion on 08/16/2022.  Had issues with continued pain after surgery and was seen by ID and treated for possible discitis. He was last seen by Dr. Luciana Axe 12/17/2022 and noted to be doing much better after completing antibiotic therapy.  Preop  labs reviewed, glucose elevated at 252 (initial CBG 363), A1c 7.7, otherwise unremarkable.  EKG 04/01/2023: Sinus rhythm with marked sinus arrhythmia. Right bundle branch block. Possible Inferior infarct , age undetermined. T wave abnormality, consider lateral ischemia.  Right bundle branch block as well as inferolateral T wave abnormalities have been noted on prior EKGs in Care  Everywhere.  Nuclear stress test 12/17/21 ([facility]): IMPRESSION:  1.Fixed inferior wall and inferior septal wall near the base to the mid  inferior wall. The rest of the study is normal there is no evidence of  reversible ischemia.  2. Calculated ejection fraction is 52%  3. Totally fixed inferior and inferior septal wall defect without  reversible ischemia with an EF of 52%.   Echo 08/09/21 ([facility]): SUMMARY  There is mild concentric left ventricular hypertrophy.  Global LV systolic function is preserved  Small area of basal inferior thinning and akinesis  LV ejection fraction = 60-65%.  There is aortic valve sclerosis.  There is no aortic stenosis.  Compared to the last study dated 05/2020 LV function improved  - Comparison TEE 09/13/20 LVEF 35-40% while in rapid afib, basal inferior and inferoseptal akinesis consistent with prior MI, hypokinesis in remaining segments  Cardiac event monitor monitor 03/05/21 ([facility]): Results as outlined by EP Dr. Clydie Braun in Care Everywhere: "2% burden of atrial arrhythmia and some short runs of atrial tachycardia but no atrial fibrillation."   )        Anesthesia Quick Evaluation

## 2023-04-02 NOTE — Progress Notes (Signed)
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  Nuclear stress test 11/2021 showed fixed inferior and inferior septal wall defect without reversible ischemia.  Echo 07/2021 showed EF 60 to 65%, no significant valvular abnormalities.  Follows with cardiologist Dr. Judithe Modest at [facility].  Last follow-up was on 01/28/2023 he was noted to be doing well at that time.  Per note, "PLAN: Mr. Coupland seems to be doing well from a cardiology standpoint. He denies any anginal sounding chest discomfort and seems to be stable and asymptomatic with his coronary disease previous MI and interventions. He is maintaining sinus rhythm without recurrence of the atrial fibrillation since the pulmonary vein isolation with cryoablation in 2021. He stopped anticoagulation last year at the recommendation of the electrophysiologist with no recurrent A-fib. He had his back surgery but he still struggling with a lot of back pain. He he is following up regularly with the neurosurgeon to address this. He continues on lipid-lowering therapy as managed by his primary care provider and his cholesterol checked recently look to be well-controlled. His blood pressure was elevated here in the office today but he is in a lot of pain with his back. I will not make any changes in his medical regimen today. I will defer to his primary care provider regarding this. I will see him back in 6 months "   Underwent L2-S1 lumbar fusion on 08/16/2022.  Had issues with continued pain after surgery and was seen by ID and treated for possible discitis. He was last seen by Dr. Luciana Axe 12/17/2022 and noted to be doing much better after completing antibiotic therapy.  Preop labs reviewed, glucose elevated at 252 (initial CBG 363), A1c 7.7,  otherwise unremarkable.   EKG 04/01/2023: Sinus rhythm with marked sinus arrhythmia. Right bundle branch block. Possible Inferior infarct , age undetermined. T wave abnormality, consider lateral ischemia.  Right bundle branch block as well as inferolateral T wave abnormalities have been noted on prior EKGs in Care Everywhere.  Nuclear stress test 12/17/21 ([facility]): IMPRESSION:  1. Fixed inferior wall and inferior septal wall near the base to the mid  inferior wall.  The rest of the study is normal there is no evidence of  reversible ischemia.  2.  Calculated ejection fraction is 52%  3.  Totally fixed inferior and inferior septal wall defect without  reversible ischemia with an EF of 52%.    Echo 08/09/21 ([facility]): SUMMARY  There is mild concentric left ventricular hypertrophy.  Global LV systolic function is preserved  Small area of basal inferior thinning and akinesis  LV ejection fraction = 60-65%.  There is aortic valve sclerosis.  There is no aortic stenosis.  Compared to the last study dated 05/2020 LV function improved  - Comparison TEE 09/13/20 LVEF 35-40% while in rapid afib, basal inferior and inferoseptal akinesis consistent with prior MI, hypokinesis in remaining segments   Cardiac event monitor monitor 03/05/21 ([facility]): Results as outlined by EP Dr. Clydie Braun in Care Everywhere: "2% burden of atrial arrhythmia and some short runs of atrial tachycardia but no atrial fibrillation."      Zannie Cove [facility] Short Stay Center/Anesthesiology Phone [phone] 04/02/2023 4:13 PM

## 2023-04-08 ENCOUNTER — Ambulatory Visit: Payer: Medicare Other | Admitting: Vascular Surgery

## 2023-04-08 ENCOUNTER — Encounter: Payer: Self-pay | Admitting: Vascular Surgery

## 2023-04-08 VITALS — BP 129/84 | HR 60 | Temp 97.6°F | Resp 18 | Ht 65.0 in | Wt 165.0 lb

## 2023-04-08 DIAGNOSIS — M5416 Radiculopathy, lumbar region: Secondary | ICD-10-CM

## 2023-04-08 NOTE — Progress Notes (Signed)
Patient name: Yasmin Zucca MRN: [id number] DOB: 1956-08-28 Sex: male  REASON FOR CONSULT: Anterior spine exposure for L5-S1 ALIF  HPI: Rain Curl is a 67 y.o. male, with history of coronary artery disease, diabetes, hypertension, hyperlipidemia that presents for evaluation of anterior spine exposure for L5-S1 ALIF.  Patient has a history of chronic lower back pain.  He had a previous posterior fusion at L2-L3, L3-L4, L4-L5 and L5-S1 with Dr. Yetta Barre.  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  Dr. Wynetta Emery has recommended an anterior L5-S1 ALIF and putting a larger graft and then disconnecting the posterior construct.  Previous abdominal surgery includes a partial right nephrectomy.  Past Medical History:  Diagnosis Date   Arthritis    Cancer [facility]) 2012   Right clear cell renal cancer, s/p right partial nephrectomy 08/17/11   Coronary artery disease    inferior MI, s/p RCA stent 2002; DES ostimal ramus 08/19/19   Diabetes mellitus without complication (HCC)    Type 2   Dysrhythmia    afib, s/p ablation 09/14/20, no known recurrence (08/08/22)   GERD (gastroesophageal reflux disease)    Hyperlipemia    Hypertension    Low back pain    Myocardial infarction [facility]) 2002   RCA stent [facility])   Wears glasses     Past Surgical History:  Procedure Laterality Date   CARDIAC CATHETERIZATION     RCA stent 2002; DES ostial ramus 08/19/19   CARPAL TUNNEL RELEASE Right 12/20/2014   Procedure: RIGHT CARPAL TUNNEL RELEASE;  Surgeon: Betha Loa, MD;  Location: [facility];  Service: Orthopedics;  Laterality: Right;   ELBOW ARTHROSCOPY  09/30/2008   left   FINGER GANGLION CYST EXCISION     rt thumb   GANGLION CYST EXCISION     lt wrist   GANGLION CYST EXCISION Right 12/20/2014   Procedure: REMOVAL GANGLION OF WRIST;  Surgeon: Betha Loa, MD;  Location:  [facility];  Service: Orthopedics;  Laterality: Right;   PARTIAL NEPHRECTOMY  08/27/2011   right   POSTERIOR LUMBAR FUSION 4 LEVEL N/A 08/16/2022   Procedure: LUMBAR TWO-THREE, LUMBAR THREE-FOUR, LUMBAR FOUR-FIVE, LUMBAR FIVE-SACRAL ONE POSTERIOR LUMBAR INTERBODY FUSION;  Surgeon: Tia Alert, MD;  Location: [facility] OR;  Service: Neurosurgery;  Laterality: N/A;   SHOULDER ARTHROSCOPY  10/01/2007   left   TONSILLECTOMY     ULNAR NERVE REPAIR  09/30/2010   left   ULNAR NERVE TRANSPOSITION Right 12/20/2014   Procedure: RIGHT ULNAR NERVE DECOMPRESSION, TRANSPOSITION;  Surgeon: Betha Loa, MD;  Location: [facility];  Service: Orthopedics;  Laterality: Right;    Family History  Problem Relation Age of Onset   Heart disease Father     SOCIAL HISTORY: Social History   Socioeconomic History   Marital status: Widowed    Spouse name: Heidi   Number of children: 5   Years of education: 12   Highest education level: Not on file  Occupational History    Comment: [employer]  Tobacco Use   Smoking status: Never   Smokeless tobacco: Never  Vaping Use   Vaping Use: Never used  Substance and Sexual Activity   Alcohol use: No    Comment: last 2013   Drug use: No    Comment: used to use marajuana-last 2012   Sexual activity: Not Currently  Other Topics Concern   Not  on file  Social History Narrative   Lives with wife who has stage 4 lung cancer   Caffeine use- coffee- 2-3 cups daily   Social Determinants of Health   Financial Resource Strain: Not on file  Food Insecurity: Not on file  Transportation Needs: Not on file  Physical Activity: Not on file  Stress: Not on file  Social Connections: Not on file  Intimate Partner Violence: Not on file    Allergies  Allergen Reactions   Ezetimibe Shortness Of Breath    Chest pain and breathing issues   Penicillins Anaphylaxis   Vancomycin Rash and Anaphylaxis   Ciprofloxacin In D5w Rash   Aspirin  Other (See Comments)    At full strength, stomach irritation   Contrast Media [Iodinated Contrast Media] Hives   Ibuprofen Other (See Comments)    Stomach irritation    Ioxaglate Hives   Ciprofloxacin Rash   Evolocumab Hives and Rash   Gabapentin Hives and Rash    Current Outpatient Medications  Medication Sig Dispense Refill   aspirin 81 MG tablet Take 81 mg by mouth daily.     atenolol (TENORMIN) 50 MG tablet Take 50 mg by mouth every evening.     atorvastatin (LIPITOR) 80 MG tablet Take 80 mg by mouth daily.     B-D UF III MINI PEN NEEDLES 31G X 5 MM MISC SMARTSIG:Injection Daily     HYDROcodone-acetaminophen (NORCO/VICODIN) 5-325 MG tablet Take 1 tablet by mouth every 6 (six) hours as needed for moderate pain.     LEVEMIR FLEXPEN 100 UNIT/ML FlexPen Inject 10 Units into the skin every evening.     linezolid (ZYVOX) 600 MG tablet Take 1 tablet (600 mg total) by mouth 2 (two) times daily. (Patient not taking: Reported on 12/17/2022) 60 tablet 0   lisinopril (ZESTRIL) 10 MG tablet Take 10 mg by mouth daily.     METAMUCIL FIBER PO Take 1 Scoop by mouth every evening.     Multiple Vitamins-Minerals (PRESERVISION AREDS 2 PO) Take 1 tablet by mouth in the morning and at bedtime.     nitroGLYCERIN (NITROSTAT) 0.4 MG SL tablet Place 0.4 mg under the tongue every 5 (five) minutes as needed for chest pain.     omeprazole (PRILOSEC) 40 MG capsule Take 40 mg by mouth in the morning and at bedtime.     pregabalin (LYRICA) 50 MG capsule Take 50 mg by mouth 2 (two) times daily as needed (neuropathy).     ranolazine (RANEXA) 500 MG 12 hr tablet Take 500 mg by mouth 2 (two) times daily.     sildenafil (REVATIO) 20 MG tablet Take 2-5 tablets by mouth as needed (sexual disfunction).     sulfamethoxazole-trimethoprim (BACTRIM DS) 800-160 MG tablet Take 1 tablet by mouth 2 (two) times daily. (Patient not taking: Reported on 12/17/2022) 60 tablet 0   traZODone (DESYREL) 50 MG tablet Take 100 mg by mouth at  bedtime.  3   No current facility-administered medications for this visit.    REVIEW OF SYSTEMS:  [X]  denotes positive finding, [ ]  denotes negative finding Cardiac  Comments:  Chest pain or chest pressure:    Shortness of breath upon exertion:    Short of breath when lying flat:    Irregular heart rhythm:        Vascular    Pain in calf, thigh, or hip brought on by ambulation:    Pain in feet at night that wakes you up from your sleep:  Blood clot in your veins:    Leg swelling:         Pulmonary    Oxygen at home:    Productive cough:     Wheezing:         Neurologic    Sudden weakness in arms or legs:     Sudden numbness in arms or legs:     Sudden onset of difficulty speaking or slurred speech:    Temporary loss of vision in one eye:     Problems with dizziness:         Gastrointestinal    Blood in stool:     Vomited blood:         Genitourinary    Burning when urinating:     Blood in urine:        Psychiatric    Major depression:         Hematologic    Bleeding problems:    Problems with blood clotting too easily:        Skin    Rashes or ulcers:        Constitutional    Fever or chills:      PHYSICAL EXAM: There were no vitals filed for this visit.  GENERAL: The patient is a well-nourished male, in no acute distress. The vital signs are documented above. CARDIAC: There is a regular rate and rhythm.  VASCULAR:  Bilateral femoral pulses palpable Bilateral DP pulses palpable PULMONARY: No respiratory distress. ABDOMEN: Soft and non-tender.  Right flank incision from previous partial nephrectomy. MUSCULOSKELETAL: There are no major deformities or cyanosis. NEUROLOGIC: No focal weakness or paresthesias are detected. SKIN: There are no ulcers or rashes noted. PSYCHIATRIC: The patient has a normal affect.  DATA:   Recent MRI and CT reviewed        Assessment/Plan:  67 y.o. male, with history of coronary artery disease, diabetes,  hypertension, hyperlipidemia that presents for evaluation of anterior spine exposure for L5-S1 ALIF.  I discussed after review of his MRI and CT scan he should be a good candidate for anterior approach.  I discussed transverse incision over the left rectus muscle at the L5-S1 disc level.  Discussed mobilizing left rectus muscle and entering into the retroperitoneum and mobilizing peritoneum and left ureter across midline.  Discussed mobilizing iliac artery and vein to get the disc space exposed from the front.  Discussed risk of injury to the above structures.  Discussed risk of hernia and retrograde ejaculation.  He wishes to proceed.  All questions answered.  Scheduled for 04/14/2023 with Dr. Wynetta Emery.   Cephus Shelling, MD [facility]: [phone]

## 2023-04-14 ENCOUNTER — Inpatient Hospital Stay (HOSPITAL_COMMUNITY): Payer: Medicare Other | Admitting: Physician Assistant

## 2023-04-14 ENCOUNTER — Inpatient Hospital Stay (HOSPITAL_COMMUNITY): Payer: Medicare Other

## 2023-04-14 ENCOUNTER — Inpatient Hospital Stay (HOSPITAL_COMMUNITY): Payer: Medicare Other | Admitting: Anesthesiology

## 2023-04-14 ENCOUNTER — Other Ambulatory Visit: Payer: Self-pay

## 2023-04-14 ENCOUNTER — Inpatient Hospital Stay (HOSPITAL_COMMUNITY)
Admission: RE | Admit: 2023-04-14 | Discharge: 2023-04-17 | DRG: 453 | Disposition: A | Discharge status: Home / Self Care | Payer: Medicare Other | Source: Ambulatory Visit | Attending: Neurosurgery | Admitting: Neurosurgery

## 2023-04-14 ENCOUNTER — Encounter (HOSPITAL_COMMUNITY)
Admission: RE | Disposition: A | Discharge status: Home / Self Care | Payer: Self-pay | Source: Ambulatory Visit | Attending: Neurosurgery

## 2023-04-14 DIAGNOSIS — N5089 Other specified disorders of the male genital organs: Secondary | ICD-10-CM | POA: Diagnosis not present

## 2023-04-14 DIAGNOSIS — Z886 Allergy status to analgesic agent status: Secondary | ICD-10-CM | POA: Diagnosis not present

## 2023-04-14 DIAGNOSIS — Z905 Acquired absence of kidney: Secondary | ICD-10-CM | POA: Diagnosis not present

## 2023-04-14 DIAGNOSIS — I252 Old myocardial infarction: Secondary | ICD-10-CM

## 2023-04-14 DIAGNOSIS — M960 Pseudarthrosis after fusion or arthrodesis: Secondary | ICD-10-CM | POA: Diagnosis present

## 2023-04-14 DIAGNOSIS — E785 Hyperlipidemia, unspecified: Secondary | ICD-10-CM | POA: Diagnosis present

## 2023-04-14 DIAGNOSIS — G8929 Other chronic pain: Secondary | ICD-10-CM | POA: Diagnosis present

## 2023-04-14 DIAGNOSIS — Z880 Allergy status to penicillin: Secondary | ICD-10-CM

## 2023-04-14 DIAGNOSIS — Z8249 Family history of ischemic heart disease and other diseases of the circulatory system: Secondary | ICD-10-CM

## 2023-04-14 DIAGNOSIS — I9752 Accidental puncture and laceration of a circulatory system organ or structure during other procedure: Secondary | ICD-10-CM | POA: Diagnosis not present

## 2023-04-14 DIAGNOSIS — Z881 Allergy status to other antibiotic agents status: Secondary | ICD-10-CM | POA: Diagnosis not present

## 2023-04-14 DIAGNOSIS — Z91041 Radiographic dye allergy status: Secondary | ICD-10-CM

## 2023-04-14 DIAGNOSIS — I10 Essential (primary) hypertension: Secondary | ICD-10-CM | POA: Diagnosis present

## 2023-04-14 DIAGNOSIS — Z7982 Long term (current) use of aspirin: Secondary | ICD-10-CM | POA: Diagnosis not present

## 2023-04-14 DIAGNOSIS — E119 Type 2 diabetes mellitus without complications: Secondary | ICD-10-CM | POA: Diagnosis present

## 2023-04-14 DIAGNOSIS — S35515A Injury of left iliac vein, initial encounter: Secondary | ICD-10-CM | POA: Diagnosis not present

## 2023-04-14 DIAGNOSIS — Z794 Long term (current) use of insulin: Secondary | ICD-10-CM

## 2023-04-14 DIAGNOSIS — Z955 Presence of coronary angioplasty implant and graft: Secondary | ICD-10-CM | POA: Diagnosis not present

## 2023-04-14 DIAGNOSIS — M48062 Spinal stenosis, lumbar region with neurogenic claudication: Secondary | ICD-10-CM

## 2023-04-14 DIAGNOSIS — Z85528 Personal history of other malignant neoplasm of kidney: Secondary | ICD-10-CM

## 2023-04-14 DIAGNOSIS — M1990 Unspecified osteoarthritis, unspecified site: Secondary | ICD-10-CM | POA: Diagnosis present

## 2023-04-14 DIAGNOSIS — I2510 Atherosclerotic heart disease of native coronary artery without angina pectoris: Secondary | ICD-10-CM

## 2023-04-14 DIAGNOSIS — K219 Gastro-esophageal reflux disease without esophagitis: Secondary | ICD-10-CM | POA: Diagnosis present

## 2023-04-14 DIAGNOSIS — S32009K Unspecified fracture of unspecified lumbar vertebra, subsequent encounter for fracture with nonunion: Principal | ICD-10-CM | POA: Diagnosis present

## 2023-04-14 DIAGNOSIS — Z888 Allergy status to other drugs, medicaments and biological substances status: Secondary | ICD-10-CM

## 2023-04-14 DIAGNOSIS — M545 Low back pain, unspecified: Secondary | ICD-10-CM | POA: Diagnosis present

## 2023-04-14 DIAGNOSIS — Z79899 Other long term (current) drug therapy: Secondary | ICD-10-CM

## 2023-04-14 DIAGNOSIS — Z01818 Encounter for other preprocedural examination: Secondary | ICD-10-CM

## 2023-04-14 HISTORY — PX: ANTERIOR LUMBAR FUSION: SHX1170

## 2023-04-14 HISTORY — PX: ABDOMINAL EXPOSURE: SHX5708

## 2023-04-14 LAB — CBC WITH DIFFERENTIAL/PLATELET
Abs Immature Granulocytes: 0.04 10*3/uL (ref 0.00–0.07)
Basophils Absolute: 0 10*3/uL (ref 0.0–0.1)
Basophils Relative: 0 %
Eosinophils Absolute: 0 10*3/uL (ref 0.0–0.5)
Eosinophils Relative: 0 %
HCT: 29.3 % — ABNORMAL LOW (ref 39.0–52.0)
Hemoglobin: 9.7 g/dL — ABNORMAL LOW (ref 13.0–17.0)
Immature Granulocytes: 0 %
Lymphocytes Relative: 9 %
Lymphs Abs: 0.9 10*3/uL (ref 0.7–4.0)
MCH: 29 pg (ref 26.0–34.0)
MCHC: 33.1 g/dL (ref 30.0–36.0)
MCV: 87.7 fL (ref 80.0–100.0)
Monocytes Absolute: 0.4 10*3/uL (ref 0.1–1.0)
Monocytes Relative: 4 %
Neutro Abs: 8.4 10*3/uL — ABNORMAL HIGH (ref 1.7–7.7)
Neutrophils Relative %: 87 %
Platelets: 134 10*3/uL — ABNORMAL LOW (ref 150–400)
RBC: 3.34 MIL/uL — ABNORMAL LOW (ref 4.22–5.81)
RDW: 13.9 % (ref 11.5–15.5)
WBC: 9.8 10*3/uL (ref 4.0–10.5)
nRBC: 0 % (ref 0.0–0.2)

## 2023-04-14 LAB — BASIC METABOLIC PANEL
Anion gap: 11 (ref 5–15)
BUN: 12 mg/dL (ref 8–23)
CO2: 22 mmol/L (ref 22–32)
Calcium: 7.9 mg/dL — ABNORMAL LOW (ref 8.9–10.3)
Chloride: 102 mmol/L (ref 98–111)
Creatinine, Ser: 0.69 mg/dL (ref 0.61–1.24)
GFR, Estimated: >60 mL/min (ref >60)
Glucose, Bld: 195 mg/dL — ABNORMAL HIGH (ref 70–99)
Potassium: 4 mmol/L (ref 3.5–5.1)
Sodium: 135 mmol/L (ref 135–145)

## 2023-04-14 LAB — POCT I-STAT 7, (LYTES, BLD GAS, ICA,H+H)
Acid-base deficit: 2 mmol/L (ref 0.0–2.0)
Acid-base deficit: 2 mmol/L (ref 0.0–2.0)
Bicarbonate: 23.6 mmol/L (ref 20.0–28.0)
Bicarbonate: 24.2 mmol/L (ref 20.0–28.0)
Calcium, Ion: 1.13 mmol/L — ABNORMAL LOW (ref 1.15–1.40)
Calcium, Ion: 1.12 mmol/L — ABNORMAL LOW (ref 1.15–1.40)
HCT: 28 % — ABNORMAL LOW (ref 39.0–52.0)
HCT: 29 % — ABNORMAL LOW (ref 39.0–52.0)
Hemoglobin: 9.5 g/dL — ABNORMAL LOW (ref 13.0–17.0)
Hemoglobin: 9.9 g/dL — ABNORMAL LOW (ref 13.0–17.0)
O2 Saturation: 100 %
O2 Saturation: 98 %
Patient temperature: 34.7
Patient temperature: 35.2
Potassium: 4.2 mmol/L (ref 3.5–5.1)
Potassium: 4.4 mmol/L (ref 3.5–5.1)
Sodium: 135 mmol/L (ref 135–145)
Sodium: 137 mmol/L (ref 135–145)
TCO2: 25 mmol/L (ref 22–32)
TCO2: 26 mmol/L (ref 22–32)
pCO2 arterial: 37.1 mmHg (ref 32–48)
pCO2 arterial: 41.7 mmHg (ref 32–48)
pH, Arterial: 7.402 (ref 7.35–7.45)
pH, Arterial: 7.364 (ref 7.35–7.45)
pO2, Arterial: 189 mmHg — ABNORMAL HIGH (ref 83–108)
pO2, Arterial: 97 mmHg (ref 83–108)

## 2023-04-14 LAB — BPAM RBC
Blood Product Expiration Date: 202408122359
Blood Product Expiration Date: 202408122359
Blood Product Expiration Date: 202408132359
ISSUE DATE / TIME: 202407150850
ISSUE DATE / TIME: 202407150906
Unit Type and Rh: 5100
Unit Type and Rh: 5100

## 2023-04-14 LAB — TYPE AND SCREEN
Unit division: 0
Unit division: 0
Unit division: 0

## 2023-04-14 LAB — GLUCOSE, CAPILLARY
Glucose-Capillary: 134 mg/dL — ABNORMAL HIGH (ref 70–99)
Glucose-Capillary: 185 mg/dL — ABNORMAL HIGH (ref 70–99)
Glucose-Capillary: 199 mg/dL — ABNORMAL HIGH (ref 70–99)
Glucose-Capillary: 313 mg/dL — ABNORMAL HIGH (ref 70–99)

## 2023-04-14 LAB — PREPARE RBC (CROSSMATCH)

## 2023-04-14 SURGERY — ANTERIOR LUMBAR FUSION 1 LEVEL
Anesthesia: General | Site: Spine Lumbar

## 2023-04-14 MED ORDER — HYDROXYZINE HCL 50 MG/ML IM SOLN: 50.0000 mg | Freq: Four times a day (QID) | INTRAMUSCULAR | Status: DC | PRN

## 2023-04-14 MED ORDER — SODIUM CHLORIDE 0.9% FLUSH
3.0000 mL | Freq: Two times a day (BID) | INTRAVENOUS | Status: DC
  Administered 2023-04-14 – 2023-04-16 (×5): 3 mL via INTRAVENOUS

## 2023-04-14 MED ORDER — PANTOPRAZOLE SODIUM 40 MG IV SOLR: 40.0000 mg | Freq: Every day | INTRAVENOUS | Status: DC

## 2023-04-14 MED ORDER — HYDROMORPHONE HCL 1 MG/ML IJ SOLN
INTRAMUSCULAR | Status: AC
  Filled 2023-04-14: qty 1

## 2023-04-14 MED ORDER — GLYCOPYRROLATE 0.2 MG/ML IJ SOLN
INTRAMUSCULAR | Status: DC | PRN
  Administered 2023-04-14: .2 mg via INTRAVENOUS

## 2023-04-14 MED ORDER — EPHEDRINE SULFATE-NACL 50-0.9 MG/10ML-% IV SOSY
PREFILLED_SYRINGE | INTRAVENOUS | Status: DC | PRN
  Administered 2023-04-14 (×5): 10 mg via INTRAVENOUS

## 2023-04-14 MED ORDER — ONDANSETRON HCL 4 MG/2ML IJ SOLN: 4.0000 mg | Freq: Four times a day (QID) | INTRAMUSCULAR | Status: DC | PRN

## 2023-04-14 MED ORDER — INSULIN DETEMIR 100 UNIT/ML ~~LOC~~ SOLN
10.0000 [IU] | Freq: Every evening | SUBCUTANEOUS | Status: DC
  Administered 2023-04-14 – 2023-04-16 (×3): 10 [IU] via SUBCUTANEOUS
  Filled 2023-04-14 (×6): qty 0.1

## 2023-04-14 MED ORDER — FENTANYL CITRATE (PF) 100 MCG/2ML IJ SOLN
INTRAMUSCULAR | Status: AC
  Filled 2023-04-14: qty 2

## 2023-04-14 MED ORDER — ROCURONIUM BROMIDE 10 MG/ML (PF) SYRINGE
PREFILLED_SYRINGE | INTRAVENOUS | Status: AC
  Filled 2023-04-14: qty 10

## 2023-04-14 MED ORDER — MIDAZOLAM HCL 2 MG/2ML IJ SOLN
INTRAMUSCULAR | Status: AC
  Filled 2023-04-14: qty 2

## 2023-04-14 MED ORDER — HYDROCODONE-ACETAMINOPHEN 5-325 MG PO TABS: 1.0000 | ORAL_TABLET | Freq: Four times a day (QID) | ORAL | Status: DC | PRN

## 2023-04-14 MED ORDER — CHLORHEXIDINE GLUCONATE CLOTH 2 % EX PADS: 6.0000 | MEDICATED_PAD | Freq: Once | CUTANEOUS | Status: DC

## 2023-04-14 MED ORDER — SODIUM CHLORIDE 0.9 % IV SOLN
250.0000 mL | INTRAVENOUS | Status: DC
  Administered 2023-04-14: 250 mL via INTRAVENOUS

## 2023-04-14 MED ORDER — CHLORHEXIDINE GLUCONATE 0.12 % MT SOLN
OROMUCOSAL | Status: AC
  Administered 2023-04-14: 15 mL via OROMUCOSAL
  Filled 2023-04-14: qty 15

## 2023-04-14 MED ORDER — ROCURONIUM BROMIDE 10 MG/ML (PF) SYRINGE
PREFILLED_SYRINGE | INTRAVENOUS | Status: DC | PRN
  Administered 2023-04-14: 50 mg via INTRAVENOUS
  Administered 2023-04-14: 30 mg via INTRAVENOUS
  Administered 2023-04-14: 10 mg via INTRAVENOUS
  Administered 2023-04-14: 30 mg via INTRAVENOUS
  Administered 2023-04-14: 10 mg via INTRAVENOUS
  Administered 2023-04-14 (×2): 20 mg via INTRAVENOUS

## 2023-04-14 MED ORDER — SODIUM CHLORIDE 0.9% FLUSH: 3.0000 mL | INTRAVENOUS | Status: DC | PRN

## 2023-04-14 MED ORDER — TRAZODONE HCL 50 MG PO TABS
100.0000 mg | ORAL_TABLET | Freq: Every day | ORAL | Status: DC
  Administered 2023-04-14 – 2023-04-16 (×3): 100 mg via ORAL
  Filled 2023-04-14: qty 1
  Filled 2023-04-14: qty 2
  Filled 2023-04-14 (×2): qty 1

## 2023-04-14 MED ORDER — PREGABALIN 50 MG PO CAPS: 50.0000 mg | ORAL_CAPSULE | Freq: Two times a day (BID) | ORAL | Status: DC | PRN

## 2023-04-14 MED ORDER — THROMBIN 20000 UNITS EX SOLR
CUTANEOUS | Status: DC | PRN
  Administered 2023-04-14: 20 mL via TOPICAL

## 2023-04-14 MED ORDER — CYCLOBENZAPRINE HCL 10 MG PO TABS
10.0000 mg | ORAL_TABLET | Freq: Three times a day (TID) | ORAL | Status: DC | PRN
  Administered 2023-04-14 – 2023-04-15 (×5): 10 mg via ORAL
  Filled 2023-04-14 (×7): qty 1

## 2023-04-14 MED ORDER — LACTATED RINGERS IV SOLN: INTRAVENOUS | Status: DC | PRN

## 2023-04-14 MED ORDER — OXYCODONE HCL 5 MG PO TABS: 5.0000 mg | ORAL_TABLET | Freq: Once | ORAL | Status: DC | PRN

## 2023-04-14 MED ORDER — ONDANSETRON HCL 4 MG/2ML IJ SOLN
INTRAMUSCULAR | Status: AC
  Filled 2023-04-14: qty 2

## 2023-04-14 MED ORDER — ORAL CARE MOUTH RINSE: 15.0000 mL | Freq: Once | OROMUCOSAL | Status: AC

## 2023-04-14 MED ORDER — DEXAMETHASONE SODIUM PHOSPHATE 10 MG/ML IJ SOLN
INTRAMUSCULAR | Status: DC | PRN
  Administered 2023-04-14: 5 mg via INTRAVENOUS

## 2023-04-14 MED ORDER — FENTANYL CITRATE (PF) 100 MCG/2ML IJ SOLN
25.0000 ug | INTRAMUSCULAR | Status: DC | PRN
  Administered 2023-04-14 (×3): 50 ug via INTRAVENOUS

## 2023-04-14 MED ORDER — KETAMINE HCL 10 MG/ML IJ SOLN
INTRAMUSCULAR | Status: DC | PRN
  Administered 2023-04-14: 30 mg via INTRAVENOUS

## 2023-04-14 MED ORDER — PROPOFOL 10 MG/ML IV BOLUS
INTRAVENOUS | Status: DC | PRN
Start: 2023-04-14 — End: 2023-04-14
  Administered 2023-04-14: 150 mg via INTRAVENOUS
  Administered 2023-04-14: 30 mg via INTRAVENOUS

## 2023-04-14 MED ORDER — ACETAMINOPHEN 325 MG PO TABS: 650.0000 mg | ORAL_TABLET | ORAL | Status: DC | PRN

## 2023-04-14 MED ORDER — PROPOFOL 10 MG/ML IV BOLUS
INTRAVENOUS | Status: AC
  Filled 2023-04-14: qty 20

## 2023-04-14 MED ORDER — PHENOL 1.4 % MT LIQD: 1.0000 | OROMUCOSAL | Status: DC | PRN

## 2023-04-14 MED ORDER — SILDENAFIL CITRATE 20 MG PO TABS: 40.0000 mg | ORAL_TABLET | ORAL | Status: DC | PRN

## 2023-04-14 MED ORDER — CHLORHEXIDINE GLUCONATE 0.12 % MT SOLN: 15.0000 mL | Freq: Once | OROMUCOSAL | Status: AC

## 2023-04-14 MED ORDER — NITROGLYCERIN 0.4 MG SL SUBL: 0.4000 mg | SUBLINGUAL_TABLET | SUBLINGUAL | Status: DC | PRN

## 2023-04-14 MED ORDER — LIDOCAINE 2% (20 MG/ML) 5 ML SYRINGE
INTRAMUSCULAR | Status: DC | PRN
  Administered 2023-04-14: 60 mg via INTRAVENOUS

## 2023-04-14 MED ORDER — ONDANSETRON HCL 4 MG PO TABS: 4.0000 mg | ORAL_TABLET | Freq: Four times a day (QID) | ORAL | Status: DC | PRN

## 2023-04-14 MED ORDER — MIDAZOLAM HCL 2 MG/2ML IJ SOLN
INTRAMUSCULAR | Status: DC | PRN
  Administered 2023-04-14: 2 mg via INTRAVENOUS

## 2023-04-14 MED ORDER — OXYCODONE HCL 5 MG/5ML PO SOLN: 5.0000 mg | Freq: Once | ORAL | Status: DC | PRN

## 2023-04-14 MED ORDER — BUPIVACAINE HCL (PF) 0.25 % IJ SOLN
INTRAMUSCULAR | Status: AC
  Filled 2023-04-14: qty 30

## 2023-04-14 MED ORDER — RANOLAZINE ER 500 MG PO TB12
500.0000 mg | ORAL_TABLET | Freq: Two times a day (BID) | ORAL | Status: DC
  Administered 2023-04-14 – 2023-04-17 (×6): 500 mg via ORAL
  Filled 2023-04-14 (×6): qty 1

## 2023-04-14 MED ORDER — ONDANSETRON HCL 4 MG/2ML IJ SOLN
4.0000 mg | Freq: Four times a day (QID) | INTRAMUSCULAR | Status: DC | PRN
  Administered 2023-04-14: 4 mg via INTRAVENOUS
  Filled 2023-04-14: qty 2

## 2023-04-14 MED ORDER — ACETAMINOPHEN 650 MG RE SUPP: 650.0000 mg | RECTAL | Status: DC | PRN

## 2023-04-14 MED ORDER — ATENOLOL 50 MG PO TABS
50.0000 mg | ORAL_TABLET | Freq: Every evening | ORAL | Status: DC
  Administered 2023-04-14 – 2023-04-15 (×2): 50 mg via ORAL
  Filled 2023-04-14 (×3): qty 1

## 2023-04-14 MED ORDER — CEFAZOLIN SODIUM-DEXTROSE 2-4 GM/100ML-% IV SOLN: 2.0000 g | Freq: Three times a day (TID) | INTRAVENOUS | Status: DC

## 2023-04-14 MED ORDER — THROMBIN 20000 UNITS EX SOLR
CUTANEOUS | Status: AC
  Filled 2023-04-14: qty 20000

## 2023-04-14 MED ORDER — THROMBIN 5000 UNITS EX SOLR
CUTANEOUS | Status: AC
  Filled 2023-04-14: qty 5000

## 2023-04-14 MED ORDER — LABETALOL HCL 5 MG/ML IV SOLN
INTRAVENOUS | Status: DC | PRN
  Administered 2023-04-14: 10 mg via INTRAVENOUS

## 2023-04-14 MED ORDER — 0.9 % SODIUM CHLORIDE (POUR BTL) OPTIME
TOPICAL | Status: DC | PRN
  Administered 2023-04-14: 1000 mL

## 2023-04-14 MED ORDER — FENTANYL CITRATE (PF) 250 MCG/5ML IJ SOLN
INTRAMUSCULAR | Status: AC
  Filled 2023-04-14: qty 5

## 2023-04-14 MED ORDER — PHENYLEPHRINE HCL-NACL 20-0.9 MG/250ML-% IV SOLN
INTRAVENOUS | Status: AC
  Filled 2023-04-14: qty 250

## 2023-04-14 MED ORDER — HYDROMORPHONE HCL 1 MG/ML IJ SOLN
0.2500 mg | INTRAMUSCULAR | Status: DC | PRN
  Administered 2023-04-14 (×3): 0.5 mg via INTRAVENOUS

## 2023-04-14 MED ORDER — LIDOCAINE 2% (20 MG/ML) 5 ML SYRINGE
INTRAMUSCULAR | Status: AC
  Filled 2023-04-14: qty 5

## 2023-04-14 MED ORDER — LACTATED RINGERS IV SOLN: INTRAVENOUS | Status: DC

## 2023-04-14 MED ORDER — CLINDAMYCIN PHOSPHATE 900 MG/50ML IV SOLN
INTRAVENOUS | Status: AC
  Filled 2023-04-14: qty 50

## 2023-04-14 MED ORDER — SODIUM CHLORIDE 0.9 % IV SOLN: 10.0000 mL/h | Freq: Once | INTRAVENOUS | Status: DC

## 2023-04-14 MED ORDER — ALUM & MAG HYDROXIDE-SIMETH 200-200-20 MG/5ML PO SUSP: 30.0000 mL | Freq: Four times a day (QID) | ORAL | Status: DC | PRN

## 2023-04-14 MED ORDER — ONDANSETRON HCL 4 MG/2ML IJ SOLN
INTRAMUSCULAR | Status: DC | PRN
  Administered 2023-04-14: 4 mg via INTRAVENOUS

## 2023-04-14 MED ORDER — PROPOFOL 1000 MG/100ML IV EMUL
INTRAVENOUS | Status: AC
  Filled 2023-04-14: qty 200

## 2023-04-14 MED ORDER — HYDROMORPHONE HCL 1 MG/ML IJ SOLN
0.5000 mg | INTRAMUSCULAR | Status: DC | PRN
  Administered 2023-04-14: 0.5 mg via INTRAVENOUS
  Filled 2023-04-14: qty 0.5

## 2023-04-14 MED ORDER — INSULIN ASPART 100 UNIT/ML IJ SOLN: 0.0000 [IU] | INTRAMUSCULAR | Status: DC | PRN

## 2023-04-14 MED ORDER — KETAMINE HCL 50 MG/5ML IJ SOSY
PREFILLED_SYRINGE | INTRAMUSCULAR | Status: AC
  Filled 2023-04-14: qty 5

## 2023-04-14 MED ORDER — LIDOCAINE-EPINEPHRINE 1 %-1:100000 IJ SOLN
INTRAMUSCULAR | Status: AC
  Filled 2023-04-14: qty 1

## 2023-04-14 MED ORDER — HYDROMORPHONE HCL 1 MG/ML IJ SOLN
INTRAMUSCULAR | Status: AC
  Filled 2023-04-14: qty 0.5

## 2023-04-14 MED ORDER — THROMBIN 5000 UNITS EX SOLR
OROMUCOSAL | Status: DC | PRN
  Administered 2023-04-14: 5 mL via TOPICAL

## 2023-04-14 MED ORDER — MENTHOL 3 MG MT LOZG: 1.0000 | LOZENGE | OROMUCOSAL | Status: DC | PRN

## 2023-04-14 MED ORDER — CLINDAMYCIN PHOSPHATE 600 MG/50ML IV SOLN
600.0000 mg | Freq: Three times a day (TID) | INTRAVENOUS | Status: AC
  Administered 2023-04-14 (×2): 600 mg via INTRAVENOUS
  Filled 2023-04-14 (×2): qty 50

## 2023-04-14 MED ORDER — DEXAMETHASONE SODIUM PHOSPHATE 10 MG/ML IJ SOLN
INTRAMUSCULAR | Status: AC
  Filled 2023-04-14: qty 1

## 2023-04-14 MED ORDER — SUGAMMADEX SODIUM 200 MG/2ML IV SOLN: INTRAVENOUS | Status: DC | PRN

## 2023-04-14 MED ORDER — HYDROCODONE-ACETAMINOPHEN 5-325 MG PO TABS
2.0000 | ORAL_TABLET | ORAL | Status: DC | PRN
  Administered 2023-04-14 – 2023-04-17 (×13): 2 via ORAL
  Filled 2023-04-14 (×14): qty 2

## 2023-04-14 MED ORDER — LISINOPRIL 10 MG PO TABS
10.0000 mg | ORAL_TABLET | Freq: Every day | ORAL | Status: DC
  Administered 2023-04-14 – 2023-04-17 (×3): 10 mg via ORAL
  Filled 2023-04-14 (×3): qty 1

## 2023-04-14 MED ORDER — PANTOPRAZOLE SODIUM 40 MG PO TBEC
40.0000 mg | DELAYED_RELEASE_TABLET | Freq: Every day | ORAL | Status: DC
  Administered 2023-04-15 – 2023-04-17 (×3): 40 mg via ORAL
  Filled 2023-04-14 (×3): qty 1

## 2023-04-14 MED ORDER — FENTANYL CITRATE (PF) 250 MCG/5ML IJ SOLN
INTRAMUSCULAR | Status: DC | PRN
  Administered 2023-04-14: 150 ug via INTRAVENOUS
  Administered 2023-04-14 (×2): 50 ug via INTRAVENOUS

## 2023-04-14 MED ORDER — SUGAMMADEX SODIUM 200 MG/2ML IV SOLN
INTRAVENOUS | Status: DC | PRN
  Administered 2023-04-14: 200 mg via INTRAVENOUS

## 2023-04-14 MED ORDER — CLINDAMYCIN PHOSPHATE 900 MG/50ML IV SOLN
900.0000 mg | INTRAVENOUS | Status: AC
  Administered 2023-04-14: 900 mg via INTRAVENOUS

## 2023-04-14 MED ORDER — ALBUMIN HUMAN 5 % IV SOLN: INTRAVENOUS | Status: DC | PRN

## 2023-04-14 MED ORDER — ASPIRIN 81 MG PO TBEC
81.0000 mg | DELAYED_RELEASE_TABLET | Freq: Every day | ORAL | Status: DC
  Administered 2023-04-15 – 2023-04-17 (×2): 81 mg via ORAL
  Filled 2023-04-14 (×3): qty 1

## 2023-04-14 MED ORDER — ATORVASTATIN CALCIUM 80 MG PO TABS
80.0000 mg | ORAL_TABLET | Freq: Every day | ORAL | Status: DC
  Administered 2023-04-15 – 2023-04-17 (×3): 80 mg via ORAL
  Filled 2023-04-14 (×3): qty 1

## 2023-04-14 MED ORDER — PHENYLEPHRINE HCL-NACL 20-0.9 MG/250ML-% IV SOLN
INTRAVENOUS | Status: DC | PRN
  Administered 2023-04-14: 30 ug/min via INTRAVENOUS

## 2023-04-14 SURGICAL SUPPLY — 101 items
ADH SKN CLS APL DERMABOND .7 (GAUZE/BANDAGES/DRESSINGS) ×3
ANCH SPNL 25 LMBR MIS (Anchor) ×9 IMPLANT
ANCHOR LUMBAR 25 MIS (Anchor) ×3 IMPLANT
APL SKNCLS STERI-STRIP NONHPOA (GAUZE/BANDAGES/DRESSINGS)
APPLIER CLIP 11 MED OPEN (CLIP) ×3
APR CLP MED 11 20 MLT OPN (CLIP) ×3
BAG COUNTER SPONGE SURGICOUNT (BAG) ×7 IMPLANT
BAG SPNG CNTER NS LX DISP (BAG) ×3
BENZOIN TINCTURE PRP APPL 2/3 (GAUZE/BANDAGES/DRESSINGS) ×2 IMPLANT
BLADE CLIPPER SURG (BLADE) IMPLANT
BLADE SURG 11 STRL SS (BLADE) ×2 IMPLANT
BONE MATRIX OSTEOCEL PRO LRG (Bone Implant) ×1 IMPLANT
BUR MATCHSTICK NEURO 3.0 LAGG (BURR) ×3 IMPLANT
BUR PRECISION FLUTE 6.0 (BURR) ×2 IMPLANT
CANISTER SUCT 3000ML PPV (MISCELLANEOUS) ×5 IMPLANT
CLIP APPLIE 11 MED OPEN (CLIP) ×5 IMPLANT
CLIP LIGATING EXTRA MED SLVR (CLIP) IMPLANT
CNTNR URN SCR LID CUP LEK RST (MISCELLANEOUS) ×2 IMPLANT
CONT SPEC 4OZ STRL OR WHT (MISCELLANEOUS)
COVER BACK TABLE 24X17X13 BIG (DRAPES) IMPLANT
COVER BACK TABLE 60X90IN (DRAPES) ×5 IMPLANT
DERMABOND ADVANCED .7 DNX12 (GAUZE/BANDAGES/DRESSINGS) ×5 IMPLANT
DRAPE C-ARM 42X72 X-RAY (DRAPES) ×7 IMPLANT
DRAPE C-ARMOR (DRAPES) ×3 IMPLANT
DRAPE HALF SHEET 40X57 (DRAPES) IMPLANT
DRAPE LAPAROTOMY 100X72X124 (DRAPES) ×5 IMPLANT
DRAPE POUCH INSTRU U-SHP 10X18 (DRAPES) ×2 IMPLANT
DRAPE SURG 17X23 STRL (DRAPES) ×2 IMPLANT
DRSG OPSITE POSTOP 4X6 (GAUZE/BANDAGES/DRESSINGS) ×3 IMPLANT
DRSG OPSITE POSTOP 4X8 (GAUZE/BANDAGES/DRESSINGS) ×1 IMPLANT
DURAPREP 26ML APPLICATOR (WOUND CARE) ×2 IMPLANT
ELECT BLADE 4.0 EZ CLEAN MEGAD (MISCELLANEOUS)
ELECT BLADE 6.5 EXT (BLADE) ×3 IMPLANT
ELECT REM PT RETURN 9FT ADLT (ELECTROSURGICAL) ×3
ELECTRODE BLDE 4.0 EZ CLN MEGD (MISCELLANEOUS) ×2 IMPLANT
ELECTRODE REM PT RTRN 9FT ADLT (ELECTROSURGICAL) ×5 IMPLANT
EVACUATOR 3/16 PVC DRAIN (DRAIN) ×2 IMPLANT
GAUZE 4X4 16PLY ~~LOC~~+RFID DBL (SPONGE) IMPLANT
GAUZE SPONGE 4X4 12PLY STRL (GAUZE/BANDAGES/DRESSINGS) ×5 IMPLANT
GLOVE BIO SURGEON STRL SZ7 (GLOVE) IMPLANT
GLOVE BIO SURGEON STRL SZ7.5 (GLOVE) ×2 IMPLANT
GLOVE BIO SURGEON STRL SZ8 (GLOVE) ×7 IMPLANT
GLOVE BIOGEL PI IND STRL 7.0 (GLOVE) IMPLANT
GLOVE BIOGEL PI IND STRL 8 (GLOVE) ×2 IMPLANT
GLOVE EXAM NITRILE XL STR (GLOVE) IMPLANT
GLOVE INDICATOR 8.5 STRL (GLOVE) ×14 IMPLANT
GLOVE SURG SS PI 6.5 STRL IVOR (GLOVE) ×3 IMPLANT
GOWN STRL REUS W/ TWL LRG LVL3 (GOWN DISPOSABLE) IMPLANT
GOWN STRL REUS W/ TWL XL LVL3 (GOWN DISPOSABLE) ×9 IMPLANT
GOWN STRL REUS W/TWL 2XL LVL3 (GOWN DISPOSABLE) ×3 IMPLANT
GOWN STRL REUS W/TWL LRG LVL3 (GOWN DISPOSABLE)
GOWN STRL REUS W/TWL XL LVL3 (GOWN DISPOSABLE) ×3
GUIDE TRIPLE BARREL ANCH 13 (ORTHOPEDIC DISPOSABLE SUPPLIES) ×1 IMPLANT
HEMOSTAT POWDER KIT SURGIFOAM (HEMOSTASIS) ×1 IMPLANT
INSERT FOGARTY 61MM (MISCELLANEOUS) IMPLANT
INSERT FOGARTY SM (MISCELLANEOUS) IMPLANT
KIT BASIN OR (CUSTOM PROCEDURE TRAY) ×5 IMPLANT
KIT INFUSE X SMALL 1.4CC (Orthopedic Implant) ×1 IMPLANT
KIT TURNOVER KIT B (KITS) ×2 IMPLANT
NDL HYPO 25X1 1.5 SAFETY (NEEDLE) ×2 IMPLANT
NDL SPNL 18GX3.5 QUINCKE PK (NEEDLE) ×2 IMPLANT
NEEDLE HYPO 25X1 1.5 SAFETY (NEEDLE) IMPLANT
NEEDLE SPNL 18GX3.5 QUINCKE PK (NEEDLE) ×3 IMPLANT
NS IRRIG 1000ML POUR BTL (IV SOLUTION) ×5 IMPLANT
PACK LAMINECTOMY NEURO (CUSTOM PROCEDURE TRAY) ×5 IMPLANT
PAD ARMBOARD 7.5X6 YLW CONV (MISCELLANEOUS) ×10 IMPLANT
PUSHER KNOT MIAMI (INSTRUMENTS) ×1 IMPLANT
ROD SPINAL THRD DISP (MISCELLANEOUS) ×1 IMPLANT
SOL ELECTROSURG ANTI STICK (MISCELLANEOUS) ×3
SOLUTION ELECTROSURG ANTI STCK (MISCELLANEOUS) ×5 IMPLANT
SPACER HEDRON IA 24X30X13 15D (Spacer) ×1 IMPLANT
SPIKE FLUID TRANSFER (MISCELLANEOUS) ×2 IMPLANT
SPONGE INTESTINAL PEANUT (DISPOSABLE) ×11 IMPLANT
SPONGE SURGIFOAM ABS GEL 100 (HEMOSTASIS) ×7 IMPLANT
SPONGE T-LAP 18X18 ~~LOC~~+RFID (SPONGE) ×3 IMPLANT
SPONGE T-LAP 4X18 ~~LOC~~+RFID (SPONGE) IMPLANT
STAPLER VISISTAT 35W (STAPLE) IMPLANT
STRIP CLOSURE SKIN 1/2X4 (GAUZE/BANDAGES/DRESSINGS) ×4 IMPLANT
SUT PDS AB 1 CTX 36 (SUTURE) ×5 IMPLANT
SUT PROLENE 4 0 RB 1 (SUTURE)
SUT PROLENE 4-0 RB1 .5 CRCL 36 (SUTURE) IMPLANT
SUT PROLENE 5 0 C1 (SUTURE) ×1 IMPLANT
SUT PROLENE 5 0 CC1 (SUTURE) IMPLANT
SUT PROLENE 6 0 C 1 30 (SUTURE) IMPLANT
SUT PROLENE 6 0 CC (SUTURE) IMPLANT
SUT SILK 0 TIES 10X30 (SUTURE) IMPLANT
SUT SILK 2 0 TIES 10X30 (SUTURE) ×2 IMPLANT
SUT SILK 2 0SH CR/8 30 (SUTURE) IMPLANT
SUT SILK 3 0 SH CR/8 (SUTURE) IMPLANT
SUT SILK 3 0 TIES 17X18 (SUTURE)
SUT SILK 3 0SH CR/8 30 (SUTURE) IMPLANT
SUT SILK 3-0 18XBRD TIE BLK (SUTURE) IMPLANT
SUT VIC AB 0 CT1 18XCR BRD8 (SUTURE) ×4 IMPLANT
SUT VIC AB 0 CT1 8-18 (SUTURE)
SUT VIC AB 2-0 CT1 18 (SUTURE) ×5 IMPLANT
SUT VIC AB 4-0 PS2 27 (SUTURE) ×3 IMPLANT
SUT VICRYL 4-0 PS2 18IN ABS (SUTURE) ×2 IMPLANT
TOWEL GREEN STERILE (TOWEL DISPOSABLE) ×7 IMPLANT
TOWEL GREEN STERILE FF (TOWEL DISPOSABLE) ×5 IMPLANT
TRAY FOLEY MTR SLVR 16FR STAT (SET/KITS/TRAYS/PACK) ×5 IMPLANT
WATER STERILE IRR 1000ML POUR (IV SOLUTION) ×5 IMPLANT

## 2023-04-14 NOTE — Anesthesia Procedure Notes (Addendum)
Arterial Line Insertion Start/End7/15/2024 7:00 AM, 04/14/2023 7:06 AM Performed by: Achille Rich, MD, Kayleen Memos, CRNA, CRNA  Patient location: OOR procedure area. Preanesthetic checklist: patient identified, IV checked, site marked, risks and benefits discussed, surgical consent, monitors and equipment checked, pre-op evaluation, timeout performed and anesthesia consent Lidocaine 1% used for infiltration Left, radial was placed Catheter size: 20 G Hand hygiene performed , maximum sterile barriers used  and Seldinger technique used Allen's test indicative of satisfactory collateral circulation Attempts: 2 Procedure performed using ultrasound guided technique. Ultrasound Notes:anatomy identified, needle tip was noted to be adjacent to the nerve/plexus identified and no ultrasound evidence of intravascular and/or intraneural injection Following insertion, dressing applied and Biopatch. Post procedure assessment: normal  Patient tolerated the procedure well with no immediate complications. Additional procedure comments: Performed by Katheran Awe, SRNA.

## 2023-04-14 NOTE — Anesthesia Postprocedure Evaluation (Signed)
Anesthesia Post Note  Patient: Adam Roach  Procedure(Roach) Performed: ANTERIOR LUMBAR INTERBODY FUSION - LUMBAR FIVE-SACRAL ONE with removal of hardware (Spine Lumbar) ABDOMINAL EXPOSURE     Patient location during evaluation: PACU Anesthesia Type: General Level of consciousness: awake and alert Pain management: pain level controlled Vital Signs Assessment: post-procedure vital signs reviewed and stable Respiratory status: spontaneous breathing, nonlabored ventilation, respiratory function stable and patient connected to nasal cannula oxygen Cardiovascular status: blood pressure returned to baseline and stable Postop Assessment: no apparent nausea or vomiting Anesthetic complications: no   No notable events documented.  Last Vitals:  Vitals:   04/14/23 1310 04/14/23 1528  BP: 111/77 120/74  Pulse: 69 64  Resp: 20 20  Temp: (!) 36.4 C (!) 36.3 C  SpO2: 96% 98%    Last Pain:  Vitals:   04/14/23 1528  TempSrc: Oral  PainSc:                  Adam Roach

## 2023-04-14 NOTE — Op Note (Signed)
Date: April 14, 2023  Preoperative diagnosis: Chronic lower back pain   Postoperative diagnosis: Same  Procedure: 1.  Anterior spine exposure via anterior retroperitoneal approach for L5-S1 ALIF 2.  Left iliac vein repair  Surgeon: Dr. Cephus Shelling, MD  Co-surgeon: Dr. Donalee Citrin, MD  Assistant: Dr. Gillis Santa, MD  Indications: 67 year old male with chronic lower back pain.  He has had a previous posterior fusion at L2-L3, L3-L4, L4-L5, and L5-S1.  He has persistent pain and now has pseudoarthrosis at L5-S1 with failure of incorporation of implant cages and loosening of the S1 screw.  He presents for anterior spine exposure of L5-S1 ALIF after risk benefits discussed.  Findings: The L5-S1 disc space was marked over the left rectus muscle.  Transverse incision was made.  We opened the anterior rectus sheath and mobilized left rectus muscle to the midline and entered lateral to the muscle into the retroperitoneum and mobilized peritoneum and left ureter to the midline.  The left iliac artery and vein were identified.  There was significant inflammation and scarring on the anterior disc space from his previous posterior implants.  During mobilization of the left iliac vein that was significant scarred in place we had an iliac vein injury that was primarily repaired with 4-0 and 5-0 Prolene.  We did have an EBL of about 1500 mL.  The patient was hemodynamically stable.  We had already exposed the anterior disc space.  We elected to proceed.    Anesthesia: General  EBL: 1500 mL  Resuscitation: 1 L albumin, 230 mL Cell Saver, 1 unit PRBCs, 3 L crystalloid  Details: Patient was taken to the operating room after informed consent was obtained.  Placed on operative table in supine position.  General endotracheal anesthesia was induced.  Fluoroscopic C-arm was then used in lateral position to mark the L5-S1 disc space over the left rectus muscle.  The abdominal wall was then prepped and draped  in standard sterile fashion.  Timeout was performed and antibiotics were given.  We then made an incision over the left rectus muscle transversely over our preoperative mark.  Dissected through the subcutaneous tissue with cautery.  Cerebellar retractors were used for added visualization.  The sheath over the left rectus muscle was then opened transversely with Bovie cautery.  The left rectus muscle was then mobilized to the midline after I raised flaps underneath the anterior rectus sheath.  I then entered lateral to muscle with blunt dissection and mobilized peritoneum and left ureter to the midline.  I visualized the left psoas as well as the left iliac vessels.  Dr. Wynetta Emery used hand-held Wiley retractors and pulled the peritoneum and left ureter to the midline.  There was significant inflammation and scarring anteriorly to the disc space at L5-S1 given previous posterior spinal hardware with surgery in the past.  I was able to mobilize the middle sacral vessels and divided these between clips.  Mobilized anteriorly on both sides of the disc space with suction and Kd.  I mobilized to the right side of the disc space.  To the left side of the disc space the iliac vein was significantly scarred and adherent even though was all the way down on the disc space.  During mobilization the left iliac vein sustained a tear while I was bluntly dissecting with KD.  There was brisk venous bleeding.  I was able to control this with manual pressure.  I did call my partner Dr. Karin Lieu for additional assistance.  We  were able to get the fixed NuVasive retractor on the field and then used sponge sticks to get control of the venotomy.  We then used multiple 4-0 and 5-0 Prolene's to repair the injury to the left iliac vein under direct vision.  Finally we had good hemostasis.  Cell Saver was used during the case.  This point in time we had already gotten adequate exposure anteriorly.  The patient was hemodynamically stable.  I was able  to put an additional arm on the NuVasive retractor to pull the left iliac vein lateral to the disc space without tearing it.  Given risk of additional injury to the left iliac vein from tension on the retractor, I stayed for the remainder of the case and assisted Dr. Wynetta Emery.  Please see his dictation.  At the end of the case we did take down the retractors and evaluated the left iliac vein where the repair appeared hemostatic.  We placed thrombin gelfoam on the left iliac vein repair with surgifoam.  Dr. Wynetta Emery closed the incision.  Taken to recovery in stable condition.    Complication: Left iliac vein injury requiring primary repair  Condition: Stable  Cephus Shelling, MD [facility] [place] Office: [phone]   Cephus Shelling

## 2023-04-14 NOTE — Transfer of Care (Signed)
Immediate Anesthesia Transfer of Care Note  Patient: Adam Roach  Procedure(s) Performed: ANTERIOR LUMBAR INTERBODY FUSION - LUMBAR FIVE-SACRAL ONE with removal of hardware (Spine Lumbar) ABDOMINAL EXPOSURE  Patient Location: PACU  Anesthesia Type:General  Level of Consciousness: awake and responds to stimulation  Airway & Oxygen Therapy: Patient Spontanous Breathing and Patient connected to face mask oxygen  Post-op Assessment: Report given to RN and Post -op Vital signs reviewed and stable  Post vital signs: Reviewed and stable  Last Vitals:  Vitals Value Taken Time  BP 135/71 04/14/23 1130  Temp    Pulse 68 04/14/23 1131  Resp 14 04/14/23 1131  SpO2 99 % 04/14/23 1131  Vitals shown include unfiled device data.  Last Pain:  Vitals:   04/14/23 0640  TempSrc:   PainSc: 10-Worst pain ever      Patients Stated Pain Goal: 0 (04/14/23 0640)  Complications: No notable events documented.

## 2023-04-14 NOTE — H&P (Signed)
Adam Roach is an 67 y.o. male.   Chief Complaint: Back and left greater right leg pain HPI: 67 year old gentleman previously undergone an L2-S1 fusion over multiple operations and has had persistent and worsening back and left leg pain.  Workup is revealed pseudoarthrosis L5-S1 with haloing around his cages and loose S1 screws.  Due to patient's progression of clinical syndrome imaging findings of a conservative treatment I recommended anterior lumbar interbody fusion at L5-S1 with exposure and removal of the old cages and possible replacement with a new larger ALIF cage and then repositioning posterior disconnecting hardware removal loose S1 screws and redo posterior lateral arthrodesis L5-S1.  I extensively gone over the risks and benefits of the operation with him as well as perioperative course expectations of outcome and alternatives of surgery and he understood and agreed to proceed forward.  Past Medical History:  Diagnosis Date   Arthritis    Cancer [facility]) 2012   Right clear cell renal cancer, s/p right partial nephrectomy 08/17/11   Coronary artery disease    inferior MI, s/p RCA stent 2002; DES ostimal ramus 08/19/19   Diabetes mellitus without complication (HCC)    Type 2   Dysrhythmia    afib, s/p ablation 09/14/20, no known recurrence (08/08/22)   GERD (gastroesophageal reflux disease)    Hyperlipemia    Hypertension    Low back pain    Myocardial infarction [facility]) 2002   RCA stent [facility])   Wears glasses     Past Surgical History:  Procedure Laterality Date   CARDIAC CATHETERIZATION     RCA stent 2002; DES ostial ramus 08/19/19   CARPAL TUNNEL RELEASE Right 12/20/2014   Procedure: RIGHT CARPAL TUNNEL RELEASE;  Surgeon: Betha Loa, MD;  Location: [facility];  Service: Orthopedics;  Laterality: Right;   ELBOW ARTHROSCOPY  09/30/2008   left   FINGER GANGLION CYST EXCISION     rt thumb   GANGLION CYST EXCISION     lt wrist   GANGLION CYST  EXCISION Right 12/20/2014   Procedure: REMOVAL GANGLION OF WRIST;  Surgeon: Betha Loa, MD;  Location: [facility];  Service: Orthopedics;  Laterality: Right;   PARTIAL NEPHRECTOMY  08/27/2011   right   POSTERIOR LUMBAR FUSION 4 LEVEL N/A 08/16/2022   Procedure: LUMBAR TWO-THREE, LUMBAR THREE-FOUR, LUMBAR FOUR-FIVE, LUMBAR FIVE-SACRAL ONE POSTERIOR LUMBAR INTERBODY FUSION;  Surgeon: Tia Alert, MD;  Location: [facility] OR;  Service: Neurosurgery;  Laterality: N/A;   SHOULDER ARTHROSCOPY  10/01/2007   left   TONSILLECTOMY     ULNAR NERVE REPAIR  09/30/2010   left   ULNAR NERVE TRANSPOSITION Right 12/20/2014   Procedure: RIGHT ULNAR NERVE DECOMPRESSION, TRANSPOSITION;  Surgeon: Betha Loa, MD;  Location: [facility];  Service: Orthopedics;  Laterality: Right;    Family History  Problem Relation Age of Onset   Heart disease Father    Social History:  reports that he has never smoked. He has never used smokeless tobacco. He reports that he does not drink alcohol and does not use drugs.  Allergies:  Allergies  Allergen Reactions   Ezetimibe Shortness Of Breath    Chest pain and breathing issues   Penicillins Anaphylaxis   Vancomycin Rash and Anaphylaxis   Ciprofloxacin In D5w Rash   Aspirin Other (See Comments)    At full strength, stomach irritation   Contrast Media [Iodinated Contrast Media] Hives   Ibuprofen Other (See Comments)    Stomach irritation  Ioxaglate Hives   Ciprofloxacin Rash   Evolocumab Hives and Rash   Gabapentin Hives and Rash    Medications Prior to Admission  Medication Sig Dispense Refill   aspirin 81 MG tablet Take 81 mg by mouth daily.     atenolol (TENORMIN) 50 MG tablet Take 50 mg by mouth every evening.     atorvastatin (LIPITOR) 80 MG tablet Take 80 mg by mouth daily.     HYDROcodone-acetaminophen (NORCO/VICODIN) 5-325 MG tablet Take 1 tablet by mouth every 6 (six) hours as needed for moderate pain.     LEVEMIR FLEXPEN  100 UNIT/ML FlexPen Inject 10 Units into the skin every evening.     lisinopril (ZESTRIL) 10 MG tablet Take 10 mg by mouth daily.     METAMUCIL FIBER PO Take 1 Scoop by mouth every evening.     Multiple Vitamins-Minerals (PRESERVISION AREDS 2 PO) Take 1 tablet by mouth in the morning and at bedtime.     nitroGLYCERIN (NITROSTAT) 0.4 MG SL tablet Place 0.4 mg under the tongue every 5 (five) minutes as needed for chest pain.     omeprazole (PRILOSEC) 40 MG capsule Take 40 mg by mouth in the morning and at bedtime.     pregabalin (LYRICA) 50 MG capsule Take 50 mg by mouth 2 (two) times daily as needed (neuropathy).     ranolazine (RANEXA) 500 MG 12 hr tablet Take 500 mg by mouth 2 (two) times daily.     sildenafil (REVATIO) 20 MG tablet Take 2-5 tablets by mouth as needed (sexual disfunction).     traZODone (DESYREL) 50 MG tablet Take 100 mg by mouth at bedtime.  3   B-D UF III MINI PEN NEEDLES 31G X 5 MM MISC SMARTSIG:Injection Daily     linezolid (ZYVOX) 600 MG tablet Take 1 tablet (600 mg total) by mouth 2 (two) times daily. (Patient not taking: Reported on 12/17/2022) 60 tablet 0   sulfamethoxazole-trimethoprim (BACTRIM DS) 800-160 MG tablet Take 1 tablet by mouth 2 (two) times daily. (Patient not taking: Reported on 12/17/2022) 60 tablet 0    Results for orders placed or performed during the hospital encounter of 04/14/23 (from the past 48 hour(s))  Glucose, capillary     Status: Abnormal   Collection Time: 04/14/23  5:56 AM  Result Value Ref Range   Glucose-Capillary 134 (H) 70 - 99 mg/dL    Comment: Glucose reference range applies only to samples taken after fasting for at least 8 hours.   No results found.  Review of Systems  Musculoskeletal:  Positive for back pain.  Neurological:  Positive for numbness.    Blood pressure (!) 160/82, pulse (!) 50, temperature 98.8 F (37.1 C), temperature source Oral, resp. rate 18, height 5\' 5"  (1.651 m), weight 74.8 kg, SpO2 98%. Physical  Exam HENT:     Head: Normocephalic.     Right Ear: Tympanic membrane normal.     Nose: Nose normal.     Mouth/Throat:     Mouth: Mucous membranes are moist.  Cardiovascular:     Rate and Rhythm: Normal rate.  Pulmonary:     Effort: Pulmonary effort is normal.  Abdominal:     General: Abdomen is flat.  Musculoskeletal:        General: Normal range of motion.     Cervical back: Normal range of motion.  Skin:    General: Skin is warm.  Neurological:     Mental Status: He is alert.  Comments: Strength is 5 out of 5 iliopsoas, quads, hamstrings, gastroc, into tibialis, and EHL.      Assessment/Plan Six 54-year-old presents for L5-S1 anterior lumbar interbody fusion with posterior removal of hardware and replacement.  Mariam Dollar, MD 04/14/2023, 7:22 AM

## 2023-04-14 NOTE — Anesthesia Procedure Notes (Signed)
Procedure Name: Intubation Date/Time: 04/14/2023 7:41 AM  Performed by: Carolynne Edouard, RNPre-anesthesia Checklist: Patient identified, Emergency Drugs available, Suction available and Patient being monitored Patient Re-evaluated:Patient Re-evaluated prior to induction Oxygen Delivery Method: Circle system utilized Preoxygenation: Pre-oxygenation with 100% oxygen Induction Type: IV induction Ventilation: Mask ventilation without difficulty Laryngoscope Size: Mac and 4 Grade View: Grade I Tube type: Oral Tube size: 7.5 mm Number of attempts: 1 Airway Equipment and Method: Stylet and Oral airway Placement Confirmation: ETT inserted through vocal cords under direct vision, positive ETCO2 and breath sounds checked- equal and bilateral Secured at: 23 cm Tube secured with: Tape Dental Injury: Teeth and Oropharynx as per pre-operative assessment

## 2023-04-14 NOTE — H&P (Signed)
Patient name: Adam Roach           MRN: [id number]        DOB: 1956/03/31          Sex: male   REASON FOR CONSULT: Anterior spine exposure for L5-S1 ALIF   HPI: Adam Roach is a 67 y.o. male, with history of coronary artery disease, diabetes, hypertension, hyperlipidemia that presents for evaluation of anterior spine exposure for L5-S1 ALIF.  Patient has a history of chronic lower back pain.  He had a previous posterior fusion at L2-L3, L3-L4, L4-L5 and L5-S1 with Dr. Yetta Barre.  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  Dr. Wynetta Emery has recommended an anterior L5-S1 ALIF and putting a larger graft and then disconnecting the posterior construct.   Previous abdominal surgery includes a partial right nephrectomy.       Past Medical History:  Diagnosis Date   Arthritis     Cancer [facility]) 2012    Right clear cell renal cancer, s/p right partial nephrectomy 08/17/11   Coronary artery disease      inferior MI, s/p RCA stent 2002; DES ostimal ramus 08/19/19   Diabetes mellitus without complication (HCC)      Type 2   Dysrhythmia      afib, s/p ablation 09/14/20, no known recurrence (08/08/22)   GERD (gastroesophageal reflux disease)     Hyperlipemia     Hypertension     Low back pain     Myocardial infarction [facility]) 2002    RCA stent [facility])   Wears glasses                 Past Surgical History:  Procedure Laterality Date   CARDIAC CATHETERIZATION        RCA stent 2002; DES ostial ramus 08/19/19   CARPAL TUNNEL RELEASE Right 12/20/2014    Procedure: RIGHT CARPAL TUNNEL RELEASE;  Surgeon: Betha Loa, MD;  Location: [facility];  Service: Orthopedics;  Laterality: Right;   ELBOW ARTHROSCOPY   09/30/2008    left   FINGER GANGLION CYST EXCISION        rt thumb   GANGLION CYST EXCISION        lt wrist   GANGLION CYST EXCISION Right 12/20/2014     Procedure: REMOVAL GANGLION OF WRIST;  Surgeon: Betha Loa, MD;  Location: [facility];  Service: Orthopedics;  Laterality: Right;   PARTIAL NEPHRECTOMY   08/27/2011    right   POSTERIOR LUMBAR FUSION 4 LEVEL N/A 08/16/2022    Procedure: LUMBAR TWO-THREE, LUMBAR THREE-FOUR, LUMBAR FOUR-FIVE, LUMBAR FIVE-SACRAL ONE POSTERIOR LUMBAR INTERBODY FUSION;  Surgeon: Tia Alert, MD;  Location: [facility];  Service: Neurosurgery;  Laterality: N/A;   SHOULDER ARTHROSCOPY   10/01/2007    left   TONSILLECTOMY       ULNAR NERVE REPAIR   09/30/2010    left   ULNAR NERVE TRANSPOSITION Right 12/20/2014    Procedure: RIGHT ULNAR NERVE DECOMPRESSION, TRANSPOSITION;  Surgeon: Betha Loa, MD;  Location: [facility];  Service: Orthopedics;  Laterality: Right;               Family History  Problem Relation Age of Onset   Heart disease Father            SOCIAL HISTORY: Social History         Socioeconomic  History   Marital status: Widowed      Spouse name: Heidi   Number of children: 5   Years of education: 12   Highest education level: Not on file  Occupational History      Comment: [employer]  Tobacco Use   Smoking status: Never   Smokeless tobacco: Never  Vaping Use   Vaping Use: Never used  Substance and Sexual Activity   Alcohol use: No      Comment: last 2013   Drug use: No      Comment: used to use marajuana-last 2012   Sexual activity: Not Currently  Other Topics Concern   Not on file  Social History Narrative    Lives with wife who has stage 4 lung cancer    Caffeine use- coffee- 2-3 cups daily    Social Determinants of Health    Financial Resource Strain: Not on file  Food Insecurity: Not on file  Transportation Needs: Not on file  Physical Activity: Not on file  Stress: Not on file  Social Connections: Not on file  Intimate Partner Violence: Not on file      Allergies       Allergies  Allergen Reactions   Ezetimibe Shortness Of  Breath      Chest pain and breathing issues   Penicillins Anaphylaxis   Vancomycin Rash and Anaphylaxis   Ciprofloxacin In D5w Rash   Aspirin Other (See Comments)      At full strength, stomach irritation   Contrast Media [Iodinated Contrast Media] Hives   Ibuprofen Other (See Comments)      Stomach irritation     Ioxaglate Hives   Ciprofloxacin Rash   Evolocumab Hives and Rash   Gabapentin Hives and Rash              Current Outpatient Medications  Medication Sig Dispense Refill   aspirin 81 MG tablet Take 81 mg by mouth daily.       atenolol (TENORMIN) 50 MG tablet Take 50 mg by mouth every evening.       atorvastatin (LIPITOR) 80 MG tablet Take 80 mg by mouth daily.       B-D UF III MINI PEN NEEDLES 31G X 5 MM MISC SMARTSIG:Injection Daily       HYDROcodone-acetaminophen (NORCO/VICODIN) 5-325 MG tablet Take 1 tablet by mouth every 6 (six) hours as needed for moderate pain.       LEVEMIR FLEXPEN 100 UNIT/ML FlexPen Inject 10 Units into the skin every evening.       linezolid (ZYVOX) 600 MG tablet Take 1 tablet (600 mg total) by mouth 2 (two) times daily. (Patient not taking: Reported on 12/17/2022) 60 tablet 0   lisinopril (ZESTRIL) 10 MG tablet Take 10 mg by mouth daily.       METAMUCIL FIBER PO Take 1 Scoop by mouth every evening.       Multiple Vitamins-Minerals (PRESERVISION AREDS 2 PO) Take 1 tablet by mouth in the morning and at bedtime.       nitroGLYCERIN (NITROSTAT) 0.4 MG SL tablet Place 0.4 mg under the tongue every 5 (five) minutes as needed for chest pain.       omeprazole (PRILOSEC) 40 MG capsule Take 40 mg by mouth in the morning and at bedtime.       pregabalin (LYRICA) 50 MG capsule Take 50 mg by mouth 2 (two) times daily as needed (neuropathy).       ranolazine (RANEXA) 500 MG 12 hr  tablet Take 500 mg by mouth 2 (two) times daily.       sildenafil (REVATIO) 20 MG tablet Take 2-5 tablets by mouth as needed (sexual disfunction).       sulfamethoxazole-trimethoprim  (BACTRIM DS) 800-160 MG tablet Take 1 tablet by mouth 2 (two) times daily. (Patient not taking: Reported on 12/17/2022) 60 tablet 0   traZODone (DESYREL) 50 MG tablet Take 100 mg by mouth at bedtime.   3      No current facility-administered medications for this visit.        REVIEW OF SYSTEMS:  [X]  denotes positive finding, [ ]  denotes negative finding Cardiac   Comments:  Chest pain or chest pressure:      Shortness of breath upon exertion:      Short of breath when lying flat:      Irregular heart rhythm:             Vascular      Pain in calf, thigh, or hip brought on by ambulation:      Pain in feet at night that wakes you up from your sleep:       Blood clot in your veins:      Leg swelling:              Pulmonary      Oxygen at home:      Productive cough:       Wheezing:              Neurologic      Sudden weakness in arms or legs:       Sudden numbness in arms or legs:       Sudden onset of difficulty speaking or slurred speech:      Temporary loss of vision in one eye:       Problems with dizziness:              Gastrointestinal      Blood in stool:       Vomited blood:              Genitourinary      Burning when urinating:       Blood in urine:             Psychiatric      Major depression:              Hematologic      Bleeding problems:      Problems with blood clotting too easily:             Skin      Rashes or ulcers:             Constitutional      Fever or chills:          PHYSICAL EXAM: There were no vitals filed for this visit.   GENERAL: The patient is a well-nourished male, in no acute distress. The vital signs are documented above. CARDIAC: There is a regular rate and rhythm.  VASCULAR:  Bilateral femoral pulses palpable Bilateral DP pulses palpable PULMONARY: No respiratory distress. ABDOMEN: Soft and non-tender.  Right flank incision from previous partial nephrectomy. MUSCULOSKELETAL: There are no major deformities or  cyanosis. NEUROLOGIC: No focal weakness or paresthesias are detected. SKIN: There are no ulcers or rashes noted. PSYCHIATRIC: The patient has a normal affect.   DATA:    Recent MRI and CT reviewed  Assessment/Plan:   67 y.o. male, with history of coronary artery disease, diabetes, hypertension, hyperlipidemia that presents for evaluation of anterior spine exposure for L5-S1 ALIF.  I discussed after review of his MRI and CT scan he should be a good candidate for anterior approach.  I discussed transverse incision over the left rectus muscle at the L5-S1 disc level.  Discussed mobilizing left rectus muscle and entering into the retroperitoneum and mobilizing peritoneum and left ureter across midline.  Discussed mobilizing iliac artery and vein to get the disc space exposed from the front.  Discussed risk of injury to the above structures.  Discussed risk of hernia and retrograde ejaculation.  He wishes to proceed.  All questions answered.  Scheduled for 04/14/2023 with Dr. Wynetta Emery.     Cephus Shelling, MD [facility]: [phone]

## 2023-04-14 NOTE — Op Note (Signed)
Preoperative diagnosis: Pseudoarthrosis L5-S1.  Postoperative diagnosis: Same.  Procedure: Anterior lumbar interbody fusion utilizing the globus ALIF cage with anchors 1 and L5 and 2 and S1 packed with Osteocel Pro and BMP  Surgeon: Jillyn Hidden Alean Kromer  Co-surgeon Dr. Sherald Hess  Assistant Dr. Bernarda Caffey  Assistant perclosure Julien Girt  Anesthesia: General  EBL: 1800 with 300 of Cell Saver given back  HPI: 67 year old gentleman with previous history of L2-S1 fusion presented with progressive worsening back and left greater than right leg pain consistent with an L5 radicular pattern workup revealed pseudoarthrosis at L5-S1.  Due to patient's progression of clinical syndrome imaging findings of a conservative treatment we recommended revision of fusion with anterior posterior reconstruction with an anterior lumbar interbody fusion cage with removal of old cages and then ultimately posterior repositioning with removal of loose S1 screws and replacement.  We extensively reviewed the risks and benefits of the operation with the patient as well as perioperative course expectations of outcome and alternatives of surgery and he understood and agreed to proceed forward.  Operative procedure: Patient was brought into the OR was [facility] general anesthesia positioned supine lower part of the abdomen was shaved prepped and draped in routine sterile fashion preoperative x-ray localized the appropriate level so a left lower transverse incision was infiltrated and made and further dictation of the exposure will be dictated by Dr. Chestine Spore from vascular surgery.  However during the exposure there was a tear and in the left iliac vein and there was an extensive mount of blood loss and this was primarily repaired and at this point Dr. Chestine Spore and I extensively talked about the utility of either aborting the procedure or proceeding forward with the anterior part of the case both of [place] felt that the patient was stable  based on reports some anesthesia blood loss blood pressure control and laboratory data but we also elected to abort the posterior part of the procedure to make sure the patient recovered from this event and the ALIF and then bring him back for stage II.  This was all carried on and decided in consultation with vascular surgery and anesthesia.  So after exposure been achieved and retractor was positioned the anterior logical ligament anterior annulus was then incised disc base was cleaned out identified the old cages and working with Epstein curettes these cages were freed up and removed then there was partial autofusion centrally that I had to drill down to create the trough of the cage and utilizing fluoroscopy to determine depth high-speed drill and curettes I freed all this up and to fit a small footprint 13 mm 15 degree globus cage.  This was packed with Osteocel Pro and BMP sandwiched between at and then this had good apposition of all the endplates and was felt to be the appropriate sizing for the implant based on fluoroscopy.  Then after the implant was placed that [facility] were deployed with 1 and L5 and 2 and S1 then with Dr. Ophelia Charter assistance we removed the retractors inspected the vein repair and then I closed the wound primarily with Prolene in the fascia subcutaneous tissue was Vicryl's and a running 4 subcuticular.  Wound was then dressed patient was sent to recovery in stable condition.  At the end the case all needle count sponge counts were correct.

## 2023-04-15 ENCOUNTER — Encounter (HOSPITAL_COMMUNITY): Payer: Self-pay | Admitting: Neurosurgery

## 2023-04-15 LAB — TYPE AND SCREEN
ABO/RH(D): O POS
Antibody Screen: NEGATIVE
Unit division: 0
Unit division: 0
Unit division: 0

## 2023-04-15 LAB — GLUCOSE, CAPILLARY
Glucose-Capillary: 141 mg/dL — ABNORMAL HIGH (ref 70–99)
Glucose-Capillary: 135 mg/dL — ABNORMAL HIGH (ref 70–99)
Glucose-Capillary: 167 mg/dL — ABNORMAL HIGH (ref 70–99)
Glucose-Capillary: 139 mg/dL — ABNORMAL HIGH (ref 70–99)

## 2023-04-15 LAB — BPAM RBC
Blood Product Expiration Date: 202408122359
Blood Product Expiration Date: 202408122359
Blood Product Expiration Date: 202408122359
ISSUE DATE / TIME: 202407151445
ISSUE DATE / TIME: 202407151834
Unit Type and Rh: 5100
Unit Type and Rh: 5100
Unit Type and Rh: 5100
Unit Type and Rh: 5100

## 2023-04-15 MED ORDER — MAGNESIUM CITRATE PO SOLN
1.0000 | Freq: Once | ORAL | Status: AC
  Administered 2023-04-15: 1 via ORAL
  Filled 2023-04-15: qty 296

## 2023-04-15 MED ORDER — POLYETHYLENE GLYCOL 3350 17 G PO PACK
17.0000 g | PACK | Freq: Every day | ORAL | Status: DC | PRN
  Administered 2023-04-15: 17 g via ORAL
  Filled 2023-04-15: qty 1

## 2023-04-15 MED ORDER — SENNOSIDES-DOCUSATE SODIUM 8.6-50 MG PO TABS
1.0000 | ORAL_TABLET | Freq: Two times a day (BID) | ORAL | Status: DC | PRN
  Administered 2023-04-15 – 2023-04-16 (×3): 1 via ORAL
  Filled 2023-04-15 (×3): qty 1

## 2023-04-15 NOTE — Progress Notes (Signed)
[facility] [place]  Subjective  -no complaints.  Walking in his room.   Objective 130/65 63 98.3 F (36.8 C) (Oral) 16 100%  Intake/Output Summary (Last 24 hours) at 04/15/2023 0756 Last data filed at 04/15/2023 0424 Gross per 24 hour  Intake 5755 ml  Output 4200 ml  Net 1555 ml    Left abdominal incision clean dry and intact Left DP palpable No significant lower extremity swelling  Laboratory Lab Results: Recent Labs    04/14/23 0955 04/14/23 1435  WBC  --  9.8  HGB 9.9* 9.7*  HCT 29.0* 29.3*  PLT  --  134*   BMET Recent Labs    04/14/23 0955 04/14/23 1435  NA 137 135  K 4.4 4.0  CL  --  102  CO2  --  22  GLUCOSE  --  195*  BUN  --  12  CREATININE  --  0.69  CALCIUM  --  7.9*    COAG Lab Results  Component Value Date   INR 1.0 08/07/2022   No results found for: "PTT"  Assessment/Planning:  Postop day 1 status post anterior spine exposure for L5-S1 ALIF.  We did have an iliac vein injury during anterior exposure given previous scarring and inflammation from his posterior instrumentation.  This was repaired IntraOp.  We did complete his anterior surgery with Dr. Wynetta Emery.  Looks great this morning.  Left DP palpable.  No significant leg swelling.  Vascular is available as needed.  Hemodynamic stable.  I did discuss with patient that the iliac vein repair puts him at increased risk of DVT.  Certainly let us know if he notices any marked swelling in the left leg.    Cephus Shelling 04/15/2023 7:56 AM --

## 2023-04-15 NOTE — Inpatient Diabetes Management (Signed)
Inpatient Diabetes Program Recommendations  AACE/ADA: New Consensus Statement on Inpatient Glycemic Control (2015)  Target Ranges:  Prepandial:   less than 140 mg/dL      Peak postprandial:   less than 180 mg/dL (1-2 hours)      Critically ill patients:  140 - 180 mg/dL   Lab Results  Component Value Date   GLUCAP 141 (H) 04/15/2023   HGBA1C 7.7 (H) 04/01/2023    Review of Glycemic Control  Latest Reference Range & Units 04/14/23 05:56 04/14/23 11:34 04/14/23 16:57 04/14/23 21:31 04/15/23 06:31  Glucose-Capillary 70 - 99 mg/dL 161 (H) 096 (H) 045 (H) 313 (H) 141 (H)   Diabetes history: DM 2 Outpatient Diabetes medications: Levemir 10 units qevening Current orders for Inpatient glycemic control:  Levemir 10 units qhs  A1c 7.7% on 7/2  Inpatient Diabetes Program Recommendations:    Note: pt received decadron 5 mg During surgery, hyperglycemia in the 300 range at night, back down this am.  -  Consider adding Novolog 0-15 units tid + hs  Thanks,  Christena Deem RN, MSN, BC-ADM Inpatient Diabetes Coordinator Team Pager [phone] (8a-5p)

## 2023-04-15 NOTE — Progress Notes (Signed)
Subjective: Patient reports patient doing well improved back and leg pain  Objective: Vital signs in last 24 hours: Temp:  [97.4 F (36.3 C)-98.3 F (36.8 C)] 98.3 F (36.8 C) (07/16 0749) Pulse Rate:  [63-81] 63 (07/16 0749) Resp:  [10-20] 16 (07/16 0749) BP: (102-135)/(57-77) 130/65 (07/16 0749) SpO2:  [91 %-100 %] 100 % (07/16 0749) Arterial Line BP: (99-124)/(49-73) 124/53 (07/15 1230)  Intake/Output from previous day: 07/15 0701 - 07/16 0700 In: 5805 [P.O.:960; I.V.:3000; Blood:545; IV Piggyback:1300] Out: 4200 [Urine:2200; Blood:2000] Intake/Output this shift: No intake/output data recorded.  Awake alert strength 5-5 incision clean dry and intact  Lab Results: Recent Labs    04/14/23 0955 04/14/23 1435  WBC  --  9.8  HGB 9.9* 9.7*  HCT 29.0* 29.3*  PLT  --  134*   BMET Recent Labs    04/14/23 0955 04/14/23 1435  NA 137 135  K 4.4 4.0  CL  --  102  CO2  --  22  GLUCOSE  --  195*  BUN  --  12  CREATININE  --  0.69  CALCIUM  --  7.9*    Studies/Results: DG Lumbar Spine 2-3 Views  Result Date: 04/14/2023 CLINICAL DATA:  Elective surgery EXAM: LUMBAR SPINE - 2-3 VIEW COMPARISON:  CT 01/17/2023 FINDINGS: Two low resolution intraoperative spot views of the lumbar spine. Total fluoroscopy time was 53 seconds, fluoroscopic dose of 38.86 mGy. Previous posterior lumbar fusion hardware extending to the upper sacrum. Final image demonstrates anterior fusion plate at the A2-Z3 level. IMPRESSION: Intraoperative fluoroscopic assistance provided during lumbar spine surgery. Electronically Signed   By: Jasmine Pang M.D.   On: 04/14/2023 15:49   DG C-Arm 1-60 Min-No Report  Result Date: 04/14/2023 Fluoroscopy was utilized by the requesting physician.  No radiographic interpretation.   DG C-Arm 1-60 Min-No Report  Result Date: 04/14/2023 Fluoroscopy was utilized by the requesting physician.  No radiographic interpretation.   DG C-Arm 1-60 Min-No Report  Result Date:  04/14/2023 Fluoroscopy was utilized by the requesting physician.  No radiographic interpretation.   DG C-Arm 1-60 Min-No Report  Result Date: 04/14/2023 Fluoroscopy was utilized by the requesting physician.  No radiographic interpretation.   DG OR LOCAL ABDOMEN  Result Date: 04/14/2023 CLINICAL DATA:  Status post L5-S1 ALIF. Rule out retained foreign body. EXAM: OR LOCAL ABDOMEN COMPARISON:  CT 01/17/2023 FINDINGS: Single portable radiograph obtained in the operating room was submitted. Postoperative changes from L5-S1 ALIF. Signs of previous posterior lumbar hardware fixation also identified. No definite retained radiopaque foreign bodies identified. 7 mm curvilinear density in the projection of the inferior left sacrum just medial to the SI joint is noted which is of uncertain significance. IMPRESSION: 1. No definite retained radiopaque foreign bodies identified. 2. 7 mm curvilinear density in the projection of the inferior left sacrum just medial to the SI joint is of uncertain significance. These results were called by telephone at the time of interpretation on 04/14/2023 at 11:20 am to operating room at [phone]. The OR staff acknowledged these findings. Electronically Signed   By: Signa Kell M.D.   On: 04/14/2023 11:21    Assessment/Plan: Overnight hematocrit stable continue to mobilize with physical therapy patient does want to proceed forward with posterior operation will get that scheduled and plan hospitalization accordingly  LOS: 1 day     Mariam Dollar 04/15/2023, 8:23 AM

## 2023-04-15 NOTE — Evaluation (Signed)
Occupational Therapy Evaluation Patient Details Name: Adam Roach MRN: [id number] DOB: 07-22-1956 Today's Date: 04/15/2023   History of Present Illness 67 yo M adm 7/16 for scheduled ALIF.  PMH includes: PLIF, CAD, T2DM, HTN.   Clinical Impression   Patient admitted for the diagnosis above.  PTA he lives at home alone, but will be staying with his SO post surgery as needed.  Patient may be having an addition surgery today/tomorrow to remove loose hardware.  OT will plan on continuing with him to ensure safety and precaution compliance.  If the patient does not have the surgery, OT will complete the ADL portion of the assessment later today, if he does, OT will continue efforts.  No post acute OT is anticipated.        Recommendations for follow up therapy are one component of a multi-disciplinary discharge planning process, led by the attending physician.  Recommendations may be updated based on patient status, additional functional criteria and insurance authorization.   Assistance Recommended at Discharge Set up Supervision/Assistance  Patient can return home with the following Assist for transportation;Assistance with cooking/housework    Functional Status Assessment  Patient has had a recent decline in their functional status and demonstrates the ability to make significant improvements in function in a reasonable and predictable amount of time.  Equipment Recommendations  None recommended by OT    Recommendations for Other Services       Precautions / Restrictions Precautions Precautions: Back Precaution Booklet Issued: Yes (comment) Required Braces or Orthoses: Spinal Brace Spinal Brace: Lumbar corset;Applied in sitting position;Applied in standing position Restrictions Weight Bearing Restrictions: No      Mobility Bed Mobility Overal bed mobility: Modified Independent                  Transfers Overall transfer level: Modified independent Equipment used:  Straight cane                      Balance Overall balance assessment: Mild deficits observed, not formally tested                                         ADL either performed or assessed with clinical judgement   ADL                                         General ADL Comments: TBA     Vision Patient Visual Report: No change from baseline       Perception     Praxis      Pertinent Vitals/Pain Pain Assessment Pain Assessment: Faces Faces Pain Scale: Hurts little more Pain Location: Incisional Pain Descriptors / Indicators: Tender Pain Intervention(s): Monitored during session     Hand Dominance Right   Extremity/Trunk Assessment Upper Extremity Assessment Upper Extremity Assessment: Overall WFL for tasks assessed   Lower Extremity Assessment Lower Extremity Assessment: Overall WFL for tasks assessed   Cervical / Trunk Assessment Cervical / Trunk Assessment: Back Surgery   Communication Communication Communication: No difficulties   Cognition Arousal/Alertness: Awake/alert Behavior During Therapy: WFL for tasks assessed/performed Overall Cognitive Status: Within Functional Limits for tasks assessed  General Comments   VSS on RA    Exercises     Shoulder Instructions      Home Living Family/patient expects to be discharged to:: Private residence Living Arrangements: Non-relatives/Friends;Spouse/significant other Available Help at Discharge: Friend(s);Available 24 hours/day Type of Home: House Home Access: Stairs to enter [employer] of Steps: 1 Entrance Stairs-Rails: None Home Layout: One level     Bathroom Shower/Tub: Chief Strategy Officer: Standard Bathroom Accessibility: Yes How Accessible: Accessible via walker Home Equipment: Cane - single point;Shower seat;Adaptive equipment;Rolling Walker (2 wheels);BSC/3in1;Grab bars -  tub/shower;Grab bars - toilet Adaptive Equipment: Reacher Additional Comments: pt lives alone and has low toilet and 4 stairs to enter. Plans to stay with girlfriend intially      Prior Functioning/Environment Prior Level of Function : Independent/Modified Independent;Driving                        OT Problem List: Pain      OT Treatment/Interventions: Self-care/ADL training;Therapeutic activities;Patient/family education;Balance training    OT Goals(Current goals can be found in the care plan section) Acute Rehab OT Goals Patient Stated Goal: Hoping to return home OT Goal Formulation: With patient Time For Goal Achievement: 04/29/23 Potential to Achieve Goals: Good ADL Goals Pt Will Perform Lower Body Dressing: with modified independence;sit to/from stand Pt Will Transfer to Toilet: with modified independence;ambulating;regular height toilet  OT Frequency: Min 2X/week    Co-evaluation              AM-PAC OT "6 Clicks" Daily Activity     Outcome Measure Help from another person eating meals?: None Help from another person taking care of personal grooming?: None Help from another person toileting, which includes using toliet, bedpan, or urinal?: None Help from another person bathing (including washing, rinsing, drying)?: A Little Help from another person to put on and taking off regular upper body clothing?: None Help from another person to put on and taking off regular lower body clothing?: A Little 6 Click Score: 22   End of Session Nurse Communication: Mobility status  Activity Tolerance: Patient tolerated treatment well Patient left: in bed;with call bell/phone within reach  OT Visit Diagnosis: Unsteadiness on feet (R26.81)                Time: 2841-3244 OT Time Calculation (min): 22 min Charges:  OT General Charges $OT Visit: 1 Visit OT Evaluation $OT Eval Moderate Complexity: 1 Mod  04/15/2023  RP, OTR/L  Acute Rehabilitation  Services  Office:  [phone]   Suzanna Obey 04/15/2023, 9:16 AM

## 2023-04-16 ENCOUNTER — Inpatient Hospital Stay (HOSPITAL_COMMUNITY): Payer: Medicare Other | Admitting: Registered Nurse

## 2023-04-16 ENCOUNTER — Other Ambulatory Visit: Payer: Self-pay

## 2023-04-16 ENCOUNTER — Encounter (HOSPITAL_COMMUNITY)
Admission: RE | Disposition: A | Discharge status: Home / Self Care | Payer: Self-pay | Source: Ambulatory Visit | Attending: Neurosurgery

## 2023-04-16 ENCOUNTER — Inpatient Hospital Stay (HOSPITAL_COMMUNITY): Payer: Medicare Other

## 2023-04-16 ENCOUNTER — Encounter (HOSPITAL_COMMUNITY): Payer: Self-pay | Admitting: Neurosurgery

## 2023-04-16 DIAGNOSIS — I10 Essential (primary) hypertension: Secondary | ICD-10-CM | POA: Diagnosis not present

## 2023-04-16 DIAGNOSIS — I2510 Atherosclerotic heart disease of native coronary artery without angina pectoris: Secondary | ICD-10-CM | POA: Diagnosis not present

## 2023-04-16 DIAGNOSIS — I252 Old myocardial infarction: Secondary | ICD-10-CM

## 2023-04-16 DIAGNOSIS — M960 Pseudarthrosis after fusion or arthrodesis: Secondary | ICD-10-CM

## 2023-04-16 DIAGNOSIS — S32009K Unspecified fracture of unspecified lumbar vertebra, subsequent encounter for fracture with nonunion: Secondary | ICD-10-CM | POA: Diagnosis present

## 2023-04-16 HISTORY — PX: LAMINECTOMY WITH POSTERIOR LATERAL ARTHRODESIS LEVEL 1: SHX6335

## 2023-04-16 LAB — GLUCOSE, CAPILLARY
Glucose-Capillary: 110 mg/dL — ABNORMAL HIGH (ref 70–99)
Glucose-Capillary: 95 mg/dL (ref 70–99)
Glucose-Capillary: 87 mg/dL (ref 70–99)
Glucose-Capillary: 175 mg/dL — ABNORMAL HIGH (ref 70–99)
Glucose-Capillary: 258 mg/dL — ABNORMAL HIGH (ref 70–99)

## 2023-04-16 LAB — TYPE AND SCREEN
ABO/RH(D): O POS
Antibody Screen: NEGATIVE

## 2023-04-16 SURGERY — LAMINECTOMY WITH POSTERIOR LATERAL ARTHRODESIS LEVEL 1
Anesthesia: General | Site: Back

## 2023-04-16 MED ORDER — ACETAMINOPHEN 10 MG/ML IV SOLN: 1000.0000 mg | Freq: Once | INTRAVENOUS | Status: DC | PRN

## 2023-04-16 MED ORDER — ACETAMINOPHEN 160 MG/5ML PO SOLN: 1000.0000 mg | Freq: Once | ORAL | Status: DC | PRN

## 2023-04-16 MED ORDER — OXYCODONE HCL 5 MG/5ML PO SOLN: 5.0000 mg | Freq: Once | ORAL | Status: DC | PRN

## 2023-04-16 MED ORDER — INSULIN ASPART 100 UNIT/ML IJ SOLN
0.0000 [IU] | Freq: Every day | INTRAMUSCULAR | Status: DC
  Administered 2023-04-16: 3 [IU] via SUBCUTANEOUS

## 2023-04-16 MED ORDER — ONDANSETRON HCL 4 MG/2ML IJ SOLN: 4.0000 mg | Freq: Four times a day (QID) | INTRAMUSCULAR | Status: DC | PRN

## 2023-04-16 MED ORDER — SODIUM CHLORIDE 0.9% FLUSH: 3.0000 mL | INTRAVENOUS | Status: DC | PRN

## 2023-04-16 MED ORDER — PHENYLEPHRINE HCL-NACL 20-0.9 MG/250ML-% IV SOLN
INTRAVENOUS | Status: DC | PRN
  Administered 2023-04-16: 35 ug/min via INTRAVENOUS

## 2023-04-16 MED ORDER — DEXAMETHASONE SODIUM PHOSPHATE 10 MG/ML IJ SOLN
INTRAMUSCULAR | Status: AC
  Filled 2023-04-16: qty 1

## 2023-04-16 MED ORDER — 0.9 % SODIUM CHLORIDE (POUR BTL) OPTIME
TOPICAL | Status: DC | PRN
  Administered 2023-04-16: 1000 mL

## 2023-04-16 MED ORDER — BUPIVACAINE LIPOSOME 1.3 % IJ SUSP: INTRAMUSCULAR | Status: DC | PRN

## 2023-04-16 MED ORDER — PHENOL 1.4 % MT LIQD: 1.0000 | OROMUCOSAL | Status: DC | PRN

## 2023-04-16 MED ORDER — ACETAMINOPHEN 325 MG PO TABS: 650.0000 mg | ORAL_TABLET | ORAL | Status: DC | PRN

## 2023-04-16 MED ORDER — SUGAMMADEX SODIUM 200 MG/2ML IV SOLN
INTRAVENOUS | Status: DC | PRN
  Administered 2023-04-16: 300 mg via INTRAVENOUS

## 2023-04-16 MED ORDER — VASOPRESSIN 20 UNIT/ML IV SOLN
INTRAVENOUS | Status: AC
  Filled 2023-04-16: qty 1

## 2023-04-16 MED ORDER — CHLORHEXIDINE GLUCONATE 0.12 % MT SOLN
OROMUCOSAL | Status: AC
  Administered 2023-04-16: 15 mL via OROMUCOSAL
  Filled 2023-04-16: qty 15

## 2023-04-16 MED ORDER — CLINDAMYCIN PHOSPHATE 600 MG/50ML IV SOLN
INTRAVENOUS | Status: AC
  Filled 2023-04-16: qty 50

## 2023-04-16 MED ORDER — CHLORHEXIDINE GLUCONATE 0.12 % MT SOLN: 15.0000 mL | Freq: Once | OROMUCOSAL | Status: DC

## 2023-04-16 MED ORDER — PROPOFOL 10 MG/ML IV BOLUS
INTRAVENOUS | Status: DC | PRN
  Administered 2023-04-16: 100 mg via INTRAVENOUS

## 2023-04-16 MED ORDER — FENTANYL CITRATE (PF) 250 MCG/5ML IJ SOLN
INTRAMUSCULAR | Status: AC
  Filled 2023-04-16: qty 5

## 2023-04-16 MED ORDER — ACETAMINOPHEN 500 MG PO TABS: 1000.0000 mg | ORAL_TABLET | Freq: Once | ORAL | Status: DC | PRN

## 2023-04-16 MED ORDER — VASOPRESSIN 20 UNIT/ML IV SOLN
INTRAVENOUS | Status: DC | PRN
  Administered 2023-04-16 (×6): 1 [IU] via INTRAVENOUS

## 2023-04-16 MED ORDER — INSULIN ASPART 100 UNIT/ML IJ SOLN
0.0000 [IU] | Freq: Three times a day (TID) | INTRAMUSCULAR | Status: DC
  Administered 2023-04-17: 5 [IU] via SUBCUTANEOUS

## 2023-04-16 MED ORDER — LIDOCAINE-EPINEPHRINE 1 %-1:100000 IJ SOLN
INTRAMUSCULAR | Status: AC
  Filled 2023-04-16: qty 1

## 2023-04-16 MED ORDER — ONDANSETRON HCL 4 MG/2ML IJ SOLN
INTRAMUSCULAR | Status: AC
  Filled 2023-04-16: qty 2

## 2023-04-16 MED ORDER — MIDAZOLAM HCL 2 MG/2ML IJ SOLN
INTRAMUSCULAR | Status: AC
  Filled 2023-04-16: qty 2

## 2023-04-16 MED ORDER — ORAL CARE MOUTH RINSE: 15.0000 mL | Freq: Once | OROMUCOSAL | Status: DC

## 2023-04-16 MED ORDER — SODIUM CHLORIDE 0.9 % IV SOLN: 250.0000 mL | INTRAVENOUS | Status: DC

## 2023-04-16 MED ORDER — EPHEDRINE SULFATE-NACL 50-0.9 MG/10ML-% IV SOSY
PREFILLED_SYRINGE | INTRAVENOUS | Status: DC | PRN
  Administered 2023-04-16: 10 mg via INTRAVENOUS

## 2023-04-16 MED ORDER — OXYCODONE HCL 5 MG PO TABS: 5.0000 mg | ORAL_TABLET | Freq: Once | ORAL | Status: DC | PRN

## 2023-04-16 MED ORDER — SODIUM CHLORIDE 0.9% FLUSH: 3.0000 mL | Freq: Two times a day (BID) | INTRAVENOUS | Status: DC

## 2023-04-16 MED ORDER — PHENYLEPHRINE 80 MCG/ML (10ML) SYRINGE FOR IV PUSH (FOR BLOOD PRESSURE SUPPORT)
PREFILLED_SYRINGE | INTRAVENOUS | Status: DC | PRN
  Administered 2023-04-16: 160 ug via INTRAVENOUS
  Administered 2023-04-16 (×2): 80 ug via INTRAVENOUS
  Administered 2023-04-16 (×2): 160 ug via INTRAVENOUS

## 2023-04-16 MED ORDER — DEXAMETHASONE SODIUM PHOSPHATE 10 MG/ML IJ SOLN
INTRAMUSCULAR | Status: DC | PRN
  Administered 2023-04-16: 5 mg via INTRAVENOUS

## 2023-04-16 MED ORDER — ROCURONIUM BROMIDE 10 MG/ML (PF) SYRINGE
PREFILLED_SYRINGE | INTRAVENOUS | Status: DC | PRN
  Administered 2023-04-16: 10 mg via INTRAVENOUS
  Administered 2023-04-16: 60 mg via INTRAVENOUS
  Administered 2023-04-16 (×2): 10 mg via INTRAVENOUS

## 2023-04-16 MED ORDER — MIDAZOLAM HCL 2 MG/2ML IJ SOLN
INTRAMUSCULAR | Status: DC | PRN
  Administered 2023-04-16: 1 mg via INTRAVENOUS

## 2023-04-16 MED ORDER — FENTANYL CITRATE (PF) 100 MCG/2ML IJ SOLN
25.0000 ug | INTRAMUSCULAR | Status: DC | PRN
  Administered 2023-04-16 (×2): 50 ug via INTRAVENOUS

## 2023-04-16 MED ORDER — CYCLOBENZAPRINE HCL 10 MG PO TABS
10.0000 mg | ORAL_TABLET | Freq: Three times a day (TID) | ORAL | Status: DC | PRN
  Administered 2023-04-16 – 2023-04-17 (×2): 10 mg via ORAL
  Filled 2023-04-16 (×2): qty 1

## 2023-04-16 MED ORDER — PHENYLEPHRINE 80 MCG/ML (10ML) SYRINGE FOR IV PUSH (FOR BLOOD PRESSURE SUPPORT)
PREFILLED_SYRINGE | INTRAVENOUS | Status: AC
  Filled 2023-04-16: qty 10

## 2023-04-16 MED ORDER — PANTOPRAZOLE SODIUM 40 MG IV SOLR: 40.0000 mg | Freq: Every day | INTRAVENOUS | Status: DC

## 2023-04-16 MED ORDER — THROMBIN 20000 UNITS EX SOLR
CUTANEOUS | Status: AC
  Filled 2023-04-16: qty 20000

## 2023-04-16 MED ORDER — LIDOCAINE 2% (20 MG/ML) 5 ML SYRINGE
INTRAMUSCULAR | Status: AC
  Filled 2023-04-16: qty 5

## 2023-04-16 MED ORDER — EPHEDRINE 5 MG/ML INJ
INTRAVENOUS | Status: AC
  Filled 2023-04-16: qty 5

## 2023-04-16 MED ORDER — MENTHOL 3 MG MT LOZG: 1.0000 | LOZENGE | OROMUCOSAL | Status: DC | PRN

## 2023-04-16 MED ORDER — LIDOCAINE 2% (20 MG/ML) 5 ML SYRINGE
INTRAMUSCULAR | Status: DC | PRN
  Administered 2023-04-16: 60 mg via INTRAVENOUS

## 2023-04-16 MED ORDER — THROMBIN 20000 UNITS EX SOLR
CUTANEOUS | Status: DC | PRN
  Administered 2023-04-16: 20 mL via TOPICAL

## 2023-04-16 MED ORDER — CLINDAMYCIN PHOSPHATE 600 MG/50ML IV SOLN
INTRAVENOUS | Status: DC | PRN
  Administered 2023-04-16: 600 mg via INTRAVENOUS

## 2023-04-16 MED ORDER — LACTATED RINGERS IV SOLN: INTRAVENOUS | Status: DC

## 2023-04-16 MED ORDER — PROPOFOL 10 MG/ML IV BOLUS
INTRAVENOUS | Status: AC
  Filled 2023-04-16: qty 20

## 2023-04-16 MED ORDER — FENTANYL CITRATE (PF) 250 MCG/5ML IJ SOLN
INTRAMUSCULAR | Status: DC | PRN
  Administered 2023-04-16: 100 ug via INTRAVENOUS
  Administered 2023-04-16: 50 ug via INTRAVENOUS

## 2023-04-16 MED ORDER — BUPIVACAINE LIPOSOME 1.3 % IJ SUSP
INTRAMUSCULAR | Status: AC
  Filled 2023-04-16: qty 20

## 2023-04-16 MED ORDER — ONDANSETRON HCL 4 MG PO TABS: 4.0000 mg | ORAL_TABLET | Freq: Four times a day (QID) | ORAL | Status: DC | PRN

## 2023-04-16 MED ORDER — LIDOCAINE-EPINEPHRINE 1 %-1:100000 IJ SOLN
INTRAMUSCULAR | Status: DC | PRN
  Administered 2023-04-16: 10 mL

## 2023-04-16 MED ORDER — ONDANSETRON HCL 4 MG/2ML IJ SOLN
INTRAMUSCULAR | Status: DC | PRN
  Administered 2023-04-16: 4 mg via INTRAVENOUS

## 2023-04-16 MED ORDER — ACETAMINOPHEN 650 MG RE SUPP: 650.0000 mg | RECTAL | Status: DC | PRN

## 2023-04-16 MED ORDER — ALUM & MAG HYDROXIDE-SIMETH 200-200-20 MG/5ML PO SUSP: 30.0000 mL | Freq: Four times a day (QID) | ORAL | Status: DC | PRN

## 2023-04-16 MED ORDER — CHLORHEXIDINE GLUCONATE 0.12 % MT SOLN
15.0000 mL | OROMUCOSAL | Status: AC
  Filled 2023-04-16: qty 15

## 2023-04-16 MED ORDER — FENTANYL CITRATE (PF) 100 MCG/2ML IJ SOLN
INTRAMUSCULAR | Status: AC
  Filled 2023-04-16: qty 2

## 2023-04-16 MED ORDER — ROCURONIUM BROMIDE 10 MG/ML (PF) SYRINGE
PREFILLED_SYRINGE | INTRAVENOUS | Status: AC
  Filled 2023-04-16: qty 10

## 2023-04-16 SURGICAL SUPPLY — 65 items
ADH SKN CLS APL DERMABOND .7 (GAUZE/BANDAGES/DRESSINGS) ×1
APL SKNCLS STERI-STRIP NONHPOA (GAUZE/BANDAGES/DRESSINGS)
BAG COUNTER SPONGE SURGICOUNT (BAG) ×1 IMPLANT
BAG SPNG CNTER NS LX DISP (BAG) ×1
BENZOIN TINCTURE PRP APPL 2/3 (GAUZE/BANDAGES/DRESSINGS) ×1 IMPLANT
BLADE CLIPPER SURG (BLADE) IMPLANT
BLADE SURG 11 STRL SS (BLADE) ×1 IMPLANT
BONE VIVIGEN FORMABLE 10CC (Bone Implant) ×1 IMPLANT
BUR MATCHSTICK NEURO 3.0 LAGG (BURR) ×1 IMPLANT
BUR PRECISION FLUTE 6.0 (BURR) ×1 IMPLANT
CANISTER SUCT 3000ML PPV (MISCELLANEOUS) ×1 IMPLANT
CNTNR URN SCR LID CUP LEK RST (MISCELLANEOUS) ×1 IMPLANT
CONT SPEC 4OZ STRL OR WHT (MISCELLANEOUS) ×1
COVER BACK TABLE 24X17X13 BIG (DRAPES) IMPLANT
COVER BACK TABLE 60X90IN (DRAPES) ×1 IMPLANT
DERMABOND ADVANCED .7 DNX12 (GAUZE/BANDAGES/DRESSINGS) ×1 IMPLANT
DRAPE C-ARM 42X72 X-RAY (DRAPES) ×2 IMPLANT
DRAPE HALF SHEET 40X57 (DRAPES) IMPLANT
DRAPE LAPAROTOMY 100X72X124 (DRAPES) ×1 IMPLANT
DRAPE POUCH INSTRU U-SHP 10X18 (DRAPES) ×1 IMPLANT
DRAPE SURG 17X23 STRL (DRAPES) ×1 IMPLANT
DRSG OPSITE 4X5.5 SM (GAUZE/BANDAGES/DRESSINGS) IMPLANT
DRSG OPSITE POSTOP 4X8 (GAUZE/BANDAGES/DRESSINGS) IMPLANT
DURAPREP 26ML APPLICATOR (WOUND CARE) ×1 IMPLANT
ELECT REM PT RETURN 9FT ADLT (ELECTROSURGICAL) ×1
ELECTRODE REM PT RTRN 9FT ADLT (ELECTROSURGICAL) ×1 IMPLANT
EVACUATOR 3/16 PVC DRAIN (DRAIN) ×1 IMPLANT
GAUZE 4X4 16PLY ~~LOC~~+RFID DBL (SPONGE) IMPLANT
GAUZE SPONGE 4X4 12PLY STRL (GAUZE/BANDAGES/DRESSINGS) ×1 IMPLANT
GLOVE BIO SURGEON STRL SZ7 (GLOVE) IMPLANT
GLOVE BIO SURGEON STRL SZ8 (GLOVE) ×2 IMPLANT
GLOVE BIOGEL PI IND STRL 7.0 (GLOVE) IMPLANT
GLOVE EXAM NITRILE XL STR (GLOVE) IMPLANT
GLOVE INDICATOR 8.5 STRL (GLOVE) ×4 IMPLANT
GOWN STRL REUS W/ TWL LRG LVL3 (GOWN DISPOSABLE) IMPLANT
GOWN STRL REUS W/ TWL XL LVL3 (GOWN DISPOSABLE) ×2 IMPLANT
GOWN STRL REUS W/TWL 2XL LVL3 (GOWN DISPOSABLE) IMPLANT
GOWN STRL REUS W/TWL LRG LVL3 (GOWN DISPOSABLE)
GOWN STRL REUS W/TWL XL LVL3 (GOWN DISPOSABLE) ×2
GRAFT BNE MATRIX VG FRMBL L 10 (Bone Implant) IMPLANT
KIT BASIN OR (CUSTOM PROCEDURE TRAY) ×1 IMPLANT
KIT INFUSE XX SMALL 0.7CC (Orthopedic Implant) IMPLANT
KIT TURNOVER KIT B (KITS) ×1 IMPLANT
NDL HYPO 25X1 1.5 SAFETY (NEEDLE) ×1 IMPLANT
NEEDLE HYPO 25X1 1.5 SAFETY (NEEDLE) ×1 IMPLANT
NS IRRIG 1000ML POUR BTL (IV SOLUTION) ×1 IMPLANT
PACK LAMINECTOMY NEURO (CUSTOM PROCEDURE TRAY) ×1 IMPLANT
PAD ARMBOARD 7.5X6 YLW CONV (MISCELLANEOUS) ×3 IMPLANT
SCREW PA INV 7.5X35 (Screw) IMPLANT
SET SCREW (Screw) ×10 IMPLANT
SET SCREW SPNE (Screw) IMPLANT
SOL ELECTROSURG ANTI STICK (MISCELLANEOUS) ×1
SOLUTION ELECTROSURG ANTI STCK (MISCELLANEOUS) ×1 IMPLANT
SPIKE FLUID TRANSFER (MISCELLANEOUS) ×1 IMPLANT
SPONGE SURGIFOAM ABS GEL 100 (HEMOSTASIS) ×1 IMPLANT
SPONGE T-LAP 4X18 ~~LOC~~+RFID (SPONGE) IMPLANT
STRIP CLOSURE SKIN 1/2X4 (GAUZE/BANDAGES/DRESSINGS) ×2 IMPLANT
SUT VIC AB 0 CT1 18XCR BRD8 (SUTURE) ×2 IMPLANT
SUT VIC AB 0 CT1 8-18 (SUTURE) ×3
SUT VIC AB 2-0 CT1 18 (SUTURE) ×1 IMPLANT
SUT VICRYL 4-0 PS2 18IN ABS (SUTURE) ×1 IMPLANT
TOWEL GREEN STERILE (TOWEL DISPOSABLE) ×1 IMPLANT
TOWEL GREEN STERILE FF (TOWEL DISPOSABLE) ×1 IMPLANT
TRAY FOLEY MTR SLVR 16FR STAT (SET/KITS/TRAYS/PACK) ×1 IMPLANT
WATER STERILE IRR 1000ML POUR (IV SOLUTION) ×1 IMPLANT

## 2023-04-16 NOTE — Op Note (Signed)
Preoperative diagnosis: Pseudoarthrosis L5-S1.  Postoperative diagnosis: Same  Procedure:  reexploration of fusion with removal of hardware L5-S1 removal of bilateral loose S1 screws replacement with bilateral alar screws and redo posterior lateral arthrodesis L5-S1 utilizing BMP Vivigen and for instrumentation the Alphatec pedicle screw system.  Surgeon: Donalee Citrin.  Assistant: Dr. Coletta Memos.  Anesthesia: General.  EBL: 200.  HPI: 67 year old gentleman who had pseudoarthrosis at L5-S1 we took him to the OR on Monday and did an anterior lumbar interbody fusion and we elected to not proceed forward with stage II of the procedure.  After recovery and anesthesia was hemodynamically stable we elected to proceed forward with staged to the operation extensive went over the risks and benefits of the procedure with him as well as perioperative course expectations of outcome and alternatives of surgery and he understood and agreed to proceed forward.  Operative procedure: Patient was brought into the OR was induced under general anesthesia positioned prone on the Wilson frame his back was prepped and draped in routine sterile fashion.  His old incision was opened up and extended just slightly inferiorly scar tissue was dissected free and I exposed the hardware bilaterally all the way down to below the S1 pedicle screw.  I then disconnected the knots remove the rods and removed both loose S1 pedicle screws.  I then dissected out the sacral ala as well as lateral facet joint and TPs at L5 bilaterally.  Then after adequate exposure been achieved and fluoroscopy was brought into the field and under fluoroscopy I placed two 7.5 x 35 mm sacral alar screws.  Both screws had excellent purchase.  I then reuse previous rods which were appropriate sized with new connecting knots but prior to rod placement I aggressively decorticated the sacral ala lateral facets and TPs and packed and extensive mount of BMP and  Vivigen posterior laterally at L5-S1.  Then anchored all the rods in place tightened everything down the wound was then copiously irrigated meticulous hemostasis was maintained I placed a medium Hemovac drain and closed the wound in layers with interrupted Vicryl and a running 4 subcuticular.  Dermabond benzoin Steri-Strips sterile dressing was applied patient recovery in stable condition.  At the end the case all needle count sponge counts were correct.

## 2023-04-16 NOTE — Progress Notes (Signed)
Subjective: Patient reports patient doing well still improved leg pain had some scrotal edema and swelling  Objective: Vital signs in last 24 hours: Temp:  [98.2 F (36.8 C)-98.5 F (36.9 C)] 98.4 F (36.9 C) (07/17 1532) Pulse Rate:  [59-84] 73 (07/17 1532) Resp:  [16-20] 16 (07/17 1532) BP: (97-121)/(56-77) 112/75 (07/17 1532) SpO2:  [96 %-100 %] 100 % (07/17 1532) Weight:  [74.8 kg] 74.8 kg (07/17 1550)  Intake/Output from previous day: 07/16 0701 - 07/17 0700 In: 960 [P.O.:960] Out: -  Intake/Output this shift: No intake/output data recorded.  Awake alert strength 5 out of 5 incision clean dry and intact scrotum is swollen this was discussed with Dr. Chestine Spore felt to be expected with his EXTR lower abdominal exposure and tracking of the hemorrhage and ecchymosis through the tissues.  Lab Results: Recent Labs    04/14/23 0955 04/14/23 1435  WBC  --  9.8  HGB 9.9* 9.7*  HCT 29.0* 29.3*  PLT  --  134*   BMET Recent Labs    04/14/23 0955 04/14/23 1435  NA 137 135  K 4.4 4.0  CL  --  102  CO2  --  22  GLUCOSE  --  195*  BUN  --  12  CREATININE  --  0.69  CALCIUM  --  7.9*    Studies/Results: No results found.  Assessment/Plan: Patient presents for second stage procedure that was initially aborted on Monday we extensively gone over the risk and benefits of the posterior part of the operation with the patient as well as perioperative course expectations of outcome and alternatives of surgery and he understands and agrees to proceed forward.  LOS: 2 days     Mariam Dollar 04/16/2023, 4:22 PM

## 2023-04-16 NOTE — Progress Notes (Signed)
Occupational Therapy Treatment Patient Details Name: Adam Roach MRN: [id number] DOB: Feb 15, 1956 Today's Date: 04/16/2023   History of present illness 67 yo M adm 7/16 for scheduled ALIF.  PMH includes: PLIF, CAD, T2DM, HTN.   OT comments  This 67 yo male admitted with above seen today to re-address the scrotal sling support. 6" stockinette worked much better.   Recommendations for follow up therapy are one component of a multi-disciplinary discharge planning process, led by the attending physician.  Recommendations may be updated based on patient status, additional functional criteria and insurance authorization.    Assistance Recommended at Discharge PRN  Patient can return home with the following  A little help with walking and/or transfers;A little help with bathing/dressing/bathroom;Assistance with cooking/housework;Assist for transportation   Equipment Recommendations  None recommended by OT       Precautions / Restrictions Precautions Precautions: Back Required Braces or Orthoses: Spinal Brace Spinal Brace: Lumbar corset;Applied in sitting position;Applied in standing position Restrictions Weight Bearing Restrictions: No       Mobility Bed Mobility Overal bed mobility: Modified Independent                  Transfers Overall transfer level: Modified independent Equipment used: Rolling walker (2 wheels)                     Balance Overall balance assessment: Mild deficits observed, not formally tested                                         ADL either performed or assessed with clinical judgement   ADL                                         General ADL Comments: Pt seen a second time due to mess underwear and washcloth not working well for him. Pt fitted with 6" stockinette--worked much better.    Extremity/Trunk Assessment Upper Extremity Assessment Upper Extremity Assessment: Overall WFL for tasks  assessed   Lower Extremity Assessment Lower Extremity Assessment: Overall WFL for tasks assessed        Vision Patient Visual Report: No change from baseline            Cognition Arousal/Alertness: Awake/alert Behavior During Therapy: WFL for tasks assessed/performed Overall Cognitive Status: Within Functional Limits for tasks assessed                                                     Pertinent Vitals/ Pain       Pain Assessment Pain Assessment: Faces Faces Pain Scale: Hurts even more Pain Location: Incisional with rolling onto left side and coming up to sit (and reversing the process) Pain Descriptors / Indicators: Grimacing, Guarding Pain Intervention(s): Limited activity within patient's tolerance, Monitored during session, Repositioned         Frequency  Min 1X/week        Progress Toward Goals  OT Goals(current goals can now be found in the care plan section)  Progress towards OT goals: Progressing toward goals  Acute Rehab OT Goals Patient Stated Goal: to go home after 2nd back surgery  OT Goal Formulation: With patient Time For Goal Achievement: 04/29/23 Potential to Achieve Goals: Good  Plan Discharge plan remains appropriate       AM-PAC OT "6 Clicks" Daily Activity     Outcome Measure   Help from another person eating meals?: None Help from another person taking care of personal grooming?: None Help from another person toileting, which includes using toliet, bedpan, or urinal?: None Help from another person bathing (including washing, rinsing, drying)?: A Little Help from another person to put on and taking off regular upper body clothing?: None Help from another person to put on and taking off regular lower body clothing?: A Little 6 Click Score: 22    End of Session Equipment Utilized During Treatment: Rolling walker (2 wheels)  OT Visit Diagnosis: Unsteadiness on feet (R26.81);Pain Pain - part of body:  (incisonal  and scrotal)   Activity Tolerance Patient tolerated treatment well   Patient Left in bed;with call bell/phone within reach   Nurse Communication  (how to manage scrotal sling; handout with instructions given to staff)        Time: 8413-2440 OT Time Calculation (min): 15 min  Charges: OT General Charges $OT Visit: 1 Visit OT Treatments $Self Care/Home Management : 8-22 mins  Lindon Romp OT Acute Rehabilitation Services Office [phone]    Evette Georges 04/16/2023, 1:44 PM

## 2023-04-16 NOTE — Transfer of Care (Signed)
Immediate Anesthesia Transfer of Care Note  Patient: Adam Roach  Procedure(s) Performed: Lumbar Five-Sacral One Posterior revision of pedicle screws and posterolateral arthrodesis (Back)  Patient Location: PACU  Anesthesia Type:General  Level of Consciousness: drowsy  Airway & Oxygen Therapy: Patient Spontanous Breathing and Patient connected to nasal cannula oxygen  Post-op Assessment: Report given to RN, Post -op Vital signs reviewed and stable, and Patient moving all extremities  Post vital signs: Reviewed and stable  Last Vitals:  Vitals Value Taken Time  BP 132/60 04/16/23 1935  Temp 38.2 C 04/16/23 1935  Pulse 79 04/16/23 1940  Resp 22 04/16/23 1940  SpO2 95 % 04/16/23 1940  Vitals shown include unfiled device data.  Last Pain:  Vitals:   04/16/23 1935  TempSrc:   PainSc: Asleep      Patients Stated Pain Goal: 3 (04/16/23 0849)  Complications: There were no known notable events for this encounter.

## 2023-04-16 NOTE — Anesthesia Procedure Notes (Signed)
Procedure Name: Intubation Date/Time: 04/16/2023 5:16 PM  Performed by: Sharyn Dross, CRNAPre-anesthesia Checklist: Patient identified, Emergency Drugs available, Suction available and Patient being monitored Patient Re-evaluated:Patient Re-evaluated prior to induction Oxygen Delivery Method: Circle system utilized Preoxygenation: Pre-oxygenation with 100% oxygen Induction Type: IV induction Ventilation: Mask ventilation without difficulty Laryngoscope Size: Mac and 4 Grade View: Grade I Tube type: Oral Tube size: 7.5 mm Number of attempts: 1 Airway Equipment and Method: Stylet and Oral airway Placement Confirmation: ETT inserted through vocal cords under direct vision, positive ETCO2 and breath sounds checked- equal and bilateral Secured at: 23 cm Tube secured with: Tape Dental Injury: Teeth and Oropharynx as per pre-operative assessment

## 2023-04-16 NOTE — Anesthesia Preprocedure Evaluation (Signed)
Anesthesia Evaluation  Patient identified by MRN, date of birth, ID band Patient awake    Reviewed: Allergy & Precautions, NPO status , Patient's Chart, lab work & pertinent test results  History of Anesthesia Complications Negative for: history of anesthetic complications  Airway Mallampati: III  TM Distance: >3 FB Neck ROM: Full    Dental  (+) Dental Advisory Given   Pulmonary neg pulmonary ROS   breath sounds clear to auscultation       Cardiovascular hypertension, Pt. on medications and Pt. on home beta blockers + CAD, + Past MI and + Cardiac Stents   Rhythm:Regular     Right clear cell renal cancer, s/p right partial nephrectomy 08/17/11  Coronary artery disease     inferior MI, s/p RCA stent 2002; DES ostimal ramus 08/19/19  Diabetes mellitus without complication (HCC)     Type 2  Dysrhythmia     afib, s/p ablation 09/14/20, no known recurrence (08/08/22)  GERD (gastroesophageal reflux disease)    Hyperlipemia    Hypertension    Low back pain    Myocardial infarction [facility]) 2002   RCA stent [facility])  Wears glasses     Neuro/Psych neg Seizures  Neuromuscular disease  negative psych ROS   GI/Hepatic ,GERD  ,,  Endo/Other  diabetes    Renal/GU Renal disease     Musculoskeletal  (+) Arthritis ,    Abdominal   Peds  Hematology  (+) Blood dyscrasia, anemia Lab Results      Component                Value               Date                      WBC                      9.8                 04/14/2023                HGB                      9.7 (L)             04/14/2023                HCT                      29.3 (L)            04/14/2023                MCV                      87.7                04/14/2023                PLT                      134 (L)             04/14/2023              Anesthesia Other Findings   Reproductive/Obstetrics  Anesthesia Physical Anesthesia Plan  ASA: 3  Anesthesia Plan: General   Post-op Pain Management: Tylenol PO (pre-op)*   Induction: Intravenous  PONV Risk Score and Plan: 2 and Ondansetron and Dexamethasone  Airway Management Planned: Oral ETT  Additional Equipment: None  Intra-op Plan:   Post-operative Plan: Extubation in OR  Informed Consent: I have reviewed the patients History and Physical, chart, labs and discussed the procedure including the risks, benefits and alternatives for the proposed anesthesia with the patient or authorized representative who has indicated his/her understanding and acceptance.     Dental advisory given  Plan Discussed with: CRNA  Anesthesia Plan Comments:          Anesthesia Quick Evaluation

## 2023-04-16 NOTE — Progress Notes (Signed)
Occupational Therapy Treatment Patient Details Name: Khaidyn Staebell MRN: [id number] DOB: 22-Apr-1956 Today's Date: 04/16/2023   History of present illness 67 yo M adm 7/16 for scheduled ALIF.  PMH includes: PLIF, CAD, T2DM, HTN.   OT comments  This 67 yo male seen for a scrotal sling due to edema from back surgery. Left with mess underwear and with washcloth support--pt to let RN know if this works for him and if not then we will try something else.   Recommendations for follow up therapy are one component of a multi-disciplinary discharge planning process, led by the attending physician.  Recommendations may be updated based on patient status, additional functional criteria and insurance authorization.    Assistance Recommended at Discharge PRN  Patient can return home with the following  A little help with walking and/or transfers;A little help with bathing/dressing/bathroom;Assistance with cooking/housework;Assist for transportation   Equipment Recommendations  None recommended by OT       Precautions / Restrictions Precautions Precautions: Back Required Braces or Orthoses: Spinal Brace Spinal Brace: Lumbar corset;Applied in sitting position;Applied in standing position Restrictions Weight Bearing Restrictions: No       Mobility Bed Mobility Overal bed mobility: Modified Independent                  Transfers Overall transfer level: Modified independent Equipment used: Rolling walker (2 wheels)                     Balance Overall balance assessment: Mild deficits observed, not formally tested                                         ADL either performed or assessed with clinical judgement   ADL                                         General ADL Comments: Pt seen for scotal sling application--tried 4" stockinette which worked well until he started moving and then would slip off. Pt wanted to try underwear with support  so tried mesh underwear with 2 wash cloths to help with elevation. Asked pt the next time he got up to see how this worked and let the staff know and we would come back as needed.    Extremity/Trunk Assessment Upper Extremity Assessment Upper Extremity Assessment: Overall WFL for tasks assessed            Vision Patient Visual Report: No change from baseline            Cognition Arousal/Alertness: Awake/alert Behavior During Therapy: WFL for tasks assessed/performed Overall Cognitive Status: Within Functional Limits for tasks assessed                                                     Pertinent Vitals/ Pain       Pain Assessment Pain Assessment: Faces Faces Pain Scale: Hurts even more Pain Location: Incisional with rolling onto left side and coming up to sit (and reversing the process) Pain Descriptors / Indicators: Grimacing, Guarding Pain Intervention(s): Limited activity within patient's tolerance, Monitored during session, Repositioned  Frequency  Min 1X/week        Progress Toward Goals  OT Goals(current goals can now be found in the care plan section)  Progress towards OT goals: Progressing toward goals  Acute Rehab OT Goals Patient Stated Goal: to go home after 2nd part of back surgery OT Goal Formulation: With patient Time For Goal Achievement: 04/29/23 Potential to Achieve Goals: Good  Plan Discharge plan remains appropriate       AM-PAC OT "6 Clicks" Daily Activity     Outcome Measure   Help from another person eating meals?: None Help from another person taking care of personal grooming?: None Help from another person toileting, which includes using toliet, bedpan, or urinal?: None Help from another person bathing (including washing, rinsing, drying)?: A Little Help from another person to put on and taking off regular upper body clothing?: None Help from another person to put on and taking off regular lower body  clothing?: A Little 6 Click Score: 22    End of Session Equipment Utilized During Treatment: Rolling walker (2 wheels)  OT Visit Diagnosis: Unsteadiness on feet (R26.81);Pain Pain - part of body:  (incisional and scrotal)   Activity Tolerance Patient tolerated treatment well   Patient Left in bed;with call bell/phone within reach   Nurse Communication  (pt to let them know about mess underwear and washcloth elevation)        Time: 7829-5621 OT Time Calculation (min): 27 min  Charges: OT General Charges $OT Visit: 1 Visit OT Treatments $Self Care/Home Management : 23-37 mins  Lindon Romp OT Acute Rehabilitation Services Office [phone]    Evette Georges 04/16/2023, 1:33 PM

## 2023-04-16 NOTE — Progress Notes (Signed)
[facility]  Subjective  -complaining of scrotal swelling and discomfort   Objective 107/77 (!) 59 98.5 F (36.9 C) (Oral) 17 100%  Intake/Output Summary (Last 24 hours) at 04/16/2023 0843 Last data filed at 04/16/2023 0412 Gross per 24 hour  Intake 960 ml  Output --  Net 960 ml    Abdomen with appropriate postop incisional tenderness Notable scrotal ecchymosis Left DP palpable with no significant left leg edema  Laboratory Lab Results: Recent Labs    04/14/23 0955 04/14/23 1435  WBC  --  9.8  HGB 9.9* 9.7*  HCT 29.0* 29.3*  PLT  --  134*   BMET Recent Labs    04/14/23 0955 04/14/23 1435  NA 137 135  K 4.4 4.0  CL  --  102  CO2  --  22  GLUCOSE  --  195*  BUN  --  12  CREATININE  --  0.69  CALCIUM  --  7.9*    COAG Lab Results  Component Value Date   INR 1.0 08/07/2022   No results found for: "PTT"  Assessment/Planning:  Postop day 2 status post anterior spine exposure for L5-S1 ALIF with iliac vein repair.  Complaining of scrotal swelling and ecchymosis today.  Discussed this is from the retroperitoneal hematoma during iliac vein repair.  This should get better with time.  He can elevate his scrotum which I discussed with nursing as well as ice.  Left DP palpable with no significant leg edema.  Cephus Shelling 04/16/2023 8:43 AM --

## 2023-04-16 NOTE — Anesthesia Postprocedure Evaluation (Signed)
Anesthesia Post Note  Patient: Adam Roach  Procedure(s) Performed: Lumbar Five-Sacral One Posterior revision of pedicle screws and posterolateral arthrodesis (Back)     Patient location during evaluation: PACU Anesthesia Type: General Level of consciousness: sedated and patient cooperative Pain management: pain level controlled Vital Signs Assessment: post-procedure vital signs reviewed and stable Respiratory status: spontaneous breathing Cardiovascular status: stable Anesthetic complications: no   There were no known notable events for this encounter.  Last Vitals:  Vitals:   04/16/23 2030 04/16/23 2058  BP:  118/65  Pulse:  71  Resp:  18  Temp: 37.2 C 36.9 C  SpO2:  93%    Last Pain:  Vitals:   04/16/23 2059  TempSrc:   PainSc: 2                  Lewie Loron

## 2023-04-16 NOTE — Progress Notes (Signed)
Patient c/o scrotum swelling and painful with some bruises. MD aware will continue to monitor.

## 2023-04-17 LAB — GLUCOSE, CAPILLARY: Glucose-Capillary: 209 mg/dL — ABNORMAL HIGH (ref 70–99)

## 2023-04-17 MED ORDER — METHOCARBAMOL 750 MG PO TABS: 750.0000 mg | ORAL_TABLET | Freq: Four times a day (QID) | ORAL | 0 refills | Status: DC

## 2023-04-17 MED ORDER — OXYCODONE-ACETAMINOPHEN 5-325 MG PO TABS: 1.0000 | ORAL_TABLET | ORAL | 0 refills | Status: AC | PRN

## 2023-04-17 MED ORDER — CYCLOBENZAPRINE HCL 10 MG PO TABS: 10.0000 mg | ORAL_TABLET | Freq: Three times a day (TID) | ORAL | 0 refills | Status: DC | PRN

## 2023-04-17 NOTE — Plan of Care (Signed)

## 2023-04-17 NOTE — Discharge Summary (Signed)
Physician Discharge Summary  Patient ID: Adam Roach MRN: [id number] DOB/AGE: 11/04/1955 67 y.o.  Admit date: 04/14/2023 Discharge date: 04/17/2023  Admission Diagnoses:  Pseudoarthrosis L5-S1.     Discharge Diagnoses: same   Discharged Condition: good  Hospital Course: The patient was admitted on 04/14/2023 and taken to the operating room where the patient underwent ALIF L5-S1, replacement of cages, posterior lateral fusion with replacement of posterior hardware. The patient tolerated the procedure well and was taken to the recovery room and then to the floor in stable condition. The hospital course was routine. There were no complications. The wound remained clean dry and intact. Pt had appropriate back soreness. No complaints of leg pain or new N/T/W. The patient remained afebrile with stable vital signs, and tolerated a regular diet. The patient continued to increase activities, and pain was well controlled with oral pain medications.   Consults: None  Significant Diagnostic Studies:  Results for orders placed or performed during the hospital encounter of 04/14/23  Glucose, capillary  Result Value Ref Range   Glucose-Capillary 134 (H) 70 - 99 mg/dL  Glucose, capillary  Result Value Ref Range   Glucose-Capillary 185 (H) 70 - 99 mg/dL  Basic metabolic panel  Result Value Ref Range   Sodium 135 135 - 145 mmol/L   Potassium 4.0 3.5 - 5.1 mmol/L   Chloride 102 98 - 111 mmol/L   CO2 22 22 - 32 mmol/L   Glucose, Bld 195 (H) 70 - 99 mg/dL   BUN 12 8 - 23 mg/dL   Creatinine, Ser 7.82 0.61 - 1.24 mg/dL   Calcium 7.9 (L) 8.9 - 10.3 mg/dL   GFR, Estimated >95 >62 mL/min   Anion gap 11 5 - 15  CBC with Differential/Platelet  Result Value Ref Range   WBC 9.8 4.0 - 10.5 K/uL   RBC 3.34 (L) 4.22 - 5.81 MIL/uL   Hemoglobin 9.7 (L) 13.0 - 17.0 g/dL   HCT 13.0 (L) 86.5 - 78.4 %   MCV 87.7 80.0 - 100.0 fL   MCH 29.0 26.0 - 34.0 pg   MCHC 33.1 30.0 - 36.0 g/dL   RDW 69.6 29.5 - 28.4 %    Platelets 134 (L) 150 - 400 K/uL   nRBC 0.0 0.0 - 0.2 %   Neutrophils Relative % 87 %   Neutro Abs 8.4 (H) 1.7 - 7.7 K/uL   Lymphocytes Relative 9 %   Lymphs Abs 0.9 0.7 - 4.0 K/uL   Monocytes Relative 4 %   Monocytes Absolute 0.4 0.1 - 1.0 K/uL   Eosinophils Relative 0 %   Eosinophils Absolute 0.0 0.0 - 0.5 K/uL   Basophils Relative 0 %   Basophils Absolute 0.0 0.0 - 0.1 K/uL   Immature Granulocytes 0 %   Abs Immature Granulocytes 0.04 0.00 - 0.07 K/uL  Glucose, capillary  Result Value Ref Range   Glucose-Capillary 199 (H) 70 - 99 mg/dL  Glucose, capillary  Result Value Ref Range   Glucose-Capillary 313 (H) 70 - 99 mg/dL  Glucose, capillary  Result Value Ref Range   Glucose-Capillary 141 (H) 70 - 99 mg/dL   Comment 1 Notify RN    Comment 2 Document in Chart   Glucose, capillary  Result Value Ref Range   Glucose-Capillary 135 (H) 70 - 99 mg/dL   Comment 1 Notify RN    Comment 2 Repeat Test   Glucose, capillary  Result Value Ref Range   Glucose-Capillary 167 (H) 70 - 99 mg/dL   Comment  1 Notify RN    Comment 2 Document in Chart   Glucose, capillary  Result Value Ref Range   Glucose-Capillary 139 (H) 70 - 99 mg/dL   Comment 1 Notify RN    Comment 2 Document in Chart   Glucose, capillary  Result Value Ref Range   Glucose-Capillary 110 (H) 70 - 99 mg/dL   Comment 1 Notify RN    Comment 2 Document in Chart   Glucose, capillary  Result Value Ref Range   Glucose-Capillary 95 70 - 99 mg/dL   Comment 1 Notify RN    Comment 2 Document in Chart   Glucose, capillary  Result Value Ref Range   Glucose-Capillary 87 70 - 99 mg/dL  Glucose, capillary  Result Value Ref Range   Glucose-Capillary 175 (H) 70 - 99 mg/dL  Glucose, capillary  Result Value Ref Range   Glucose-Capillary 258 (H) 70 - 99 mg/dL   Comment 1 Notify RN    Comment 2 Document in Chart   Glucose, capillary  Result Value Ref Range   Glucose-Capillary 209 (H) 70 - 99 mg/dL   Comment 1 Notify RN     Comment 2 Document in Chart   I-STAT 7, (LYTES, BLD GAS, ICA, H+H)  Result Value Ref Range   pH, Arterial 7.402 7.35 - 7.45   pCO2 arterial 37.1 32 - 48 mmHg   pO2, Arterial 189 (H) 83 - 108 mmHg   Bicarbonate 23.6 20.0 - 28.0 mmol/L   TCO2 25 22 - 32 mmol/L   O2 Saturation 100 %   Acid-base deficit 2.0 0.0 - 2.0 mmol/L   Sodium 135 135 - 145 mmol/L   Potassium 4.2 3.5 - 5.1 mmol/L   Calcium, Ion 1.13 (L) 1.15 - 1.40 mmol/L   HCT 28.0 (L) 39.0 - 52.0 %   Hemoglobin 9.5 (L) 13.0 - 17.0 g/dL   Patient temperature 16.1 C    Sample type ARTERIAL   I-STAT 7, (LYTES, BLD GAS, ICA, H+H)  Result Value Ref Range   pH, Arterial 7.364 7.35 - 7.45   pCO2 arterial 41.7 32 - 48 mmHg   pO2, Arterial 97 83 - 108 mmHg   Bicarbonate 24.2 20.0 - 28.0 mmol/L   TCO2 26 22 - 32 mmol/L   O2 Saturation 98 %   Acid-base deficit 2.0 0.0 - 2.0 mmol/L   Sodium 137 135 - 145 mmol/L   Potassium 4.4 3.5 - 5.1 mmol/L   Calcium, Ion 1.12 (L) 1.15 - 1.40 mmol/L   HCT 29.0 (L) 39.0 - 52.0 %   Hemoglobin 9.9 (L) 13.0 - 17.0 g/dL   Patient temperature 09.6 C    Sample type ARTERIAL   Prepare RBC (crossmatch)  Result Value Ref Range   Order Confirmation      ORDER PROCESSED BY BLOOD BANK Performed at [facility], [address], [place], [place] [address]   Prepare RBC (crossmatch)  Result Value Ref Range   Order Confirmation      ORDER PROCESSED BY BLOOD BANK Performed at [facility], [address], [place], [place] [address]   Type and screen [facility]  Result Value Ref Range   ABO/RH(D) O POS    Antibody Screen NEG    Sample Expiration      04/19/2023,2359 Performed at [facility], [address], [place], [place] [address]     DG Lumbar Spine 2-3 Views  Result Date: 04/16/2023 CLINICAL DATA:  Elective surgery  EXAM: LUMBAR SPINE - 2-3 VIEW COMPARISON:  04/14/2023 FINDINGS: Two low resolution intraoperative spot views of the lumbar spine. Total  fluoroscopy time was 17 seconds, fluoroscopic dose of 15.12 mGy. Partially visualized lower lumbar posterior hardware with interbody devices visible at L3-L4, L4-L5 and L5-S1. Anterior hardware at L5-S1 with surgical instruments posterior to the upper sacrum IMPRESSION: Intraoperative fluoroscopic assistance provided during lumbar spine surgery Electronically Signed   By: Jasmine Pang M.D.   On: 04/16/2023 19:20   DG C-Arm 1-60 Min-No Report  Result Date: 04/16/2023 Fluoroscopy was utilized by the requesting physician.  No radiographic interpretation.   DG Lumbar Spine 2-3 Views  Result Date: 04/14/2023 CLINICAL DATA:  Elective surgery EXAM: LUMBAR SPINE - 2-3 VIEW COMPARISON:  CT 01/17/2023 FINDINGS: Two low resolution intraoperative spot views of the lumbar spine. Total fluoroscopy time was 53 seconds, fluoroscopic dose of 38.86 mGy. Previous posterior lumbar fusion hardware extending to the upper sacrum. Final image demonstrates anterior fusion plate at the Z6-X0 level. IMPRESSION: Intraoperative fluoroscopic assistance provided during lumbar spine surgery. Electronically Signed   By: Jasmine Pang M.D.   On: 04/14/2023 15:49   DG C-Arm 1-60 Min-No Report  Result Date: 04/14/2023 Fluoroscopy was utilized by the requesting physician.  No radiographic interpretation.   DG C-Arm 1-60 Min-No Report  Result Date: 04/14/2023 Fluoroscopy was utilized by the requesting physician.  No radiographic interpretation.   DG C-Arm 1-60 Min-No Report  Result Date: 04/14/2023 Fluoroscopy was utilized by the requesting physician.  No radiographic interpretation.   DG C-Arm 1-60 Min-No Report  Result Date: 04/14/2023 Fluoroscopy was utilized by the requesting physician.  No radiographic interpretation.   DG OR LOCAL ABDOMEN  Result Date: 04/14/2023 CLINICAL DATA:  Status post L5-S1 ALIF. Rule out retained foreign body. EXAM: OR LOCAL ABDOMEN COMPARISON:  CT 01/17/2023 FINDINGS: Single portable radiograph  obtained in the operating room was submitted. Postoperative changes from L5-S1 ALIF. Signs of previous posterior lumbar hardware fixation also identified. No definite retained radiopaque foreign bodies identified. 7 mm curvilinear density in the projection of the inferior left sacrum just medial to the SI joint is noted which is of uncertain significance. IMPRESSION: 1. No definite retained radiopaque foreign bodies identified. 2. 7 mm curvilinear density in the projection of the inferior left sacrum just medial to the SI joint is of uncertain significance. These results were called by telephone at the time of interpretation on 04/14/2023 at 11:20 am to operating room at [phone]. The OR staff acknowledged these findings. Electronically Signed   By: Signa Kell M.D.   On: 04/14/2023 11:21    Antibiotics:  Anti-infectives (From admission, onward)    Start     Dose/Rate Route Frequency Ordered Stop   04/16/23 1634  clindamycin (CLEOCIN) 600 MG/50ML IVPB       Note to Pharmacy: Susy Manor L: cabinet override      04/16/23 1634 04/16/23 1730   04/14/23 1600  clindamycin (CLEOCIN) IVPB 600 mg        600 mg 100 mL/hr over 30 Minutes Intravenous Every 8 hours 04/14/23 1352 04/15/23 0751   04/14/23 1300  ceFAZolin (ANCEF) IVPB 2g/100 mL premix  Status:  Discontinued        2 g 200 mL/hr over 30 Minutes Intravenous Every 8 hours 04/14/23 1246 04/14/23 1350   04/14/23 0600  clindamycin (CLEOCIN) IVPB 900 mg       Note to Pharmacy: Defer to pharmacy for dosing - patient allergic to Ancef and Vancomycin  900 mg 100 mL/hr over 30 Minutes Intravenous On call to O.R. 04/14/23 0553 04/14/23 0740   04/14/23 0600  clindamycin (CLEOCIN) 900 MG/50ML IVPB       Note to Pharmacy: Gleason, Ginger E: cabinet override      04/14/23 0600 04/14/23 0803       Discharge Exam: Blood pressure 112/68, pulse 75, temperature 98 F (36.7 C), temperature source Oral, resp. rate 18, height 5\' 5"  (1.651 m), weight  74.8 kg, SpO2 96%. Neurologic: Grossly normal Ambulating and voiding well incision cdi   Discharge Medications:   Allergies as of 04/17/2023       Reactions   Ezetimibe Shortness Of Breath   Chest pain and breathing issues   Penicillins Anaphylaxis   Vancomycin Rash, Anaphylaxis   Ciprofloxacin In D5w Rash   Contrast Media [iodinated Contrast Media] Hives   Ioxaglate Hives   Aspirin Other (See Comments)   At full strength, stomach irritation   Ciprofloxacin Rash   Evolocumab Hives, Rash   Gabapentin Hives, Rash   Ibuprofen Other (See Comments)   Stomach irritation        Medication List     STOP taking these medications    HYDROcodone-acetaminophen 5-325 MG tablet Commonly known as: NORCO/VICODIN   linezolid 600 MG tablet Commonly known as: ZYVOX   sulfamethoxazole-trimethoprim 800-160 MG tablet Commonly known as: BACTRIM DS       TAKE these medications    aspirin 81 MG tablet Take 81 mg by mouth daily.   atenolol 50 MG tablet Commonly known as: TENORMIN Take 50 mg by mouth every evening.   atorvastatin 80 MG tablet Commonly known as: LIPITOR Take 80 mg by mouth daily.   B-D UF III MINI PEN NEEDLES 31G X 5 MM Misc Generic drug: Insulin Pen Needle SMARTSIG:Injection Daily   cyclobenzaprine 10 MG tablet Commonly known as: FLEXERIL Take 1 tablet (10 mg total) by mouth 3 (three) times daily as needed for muscle spasms.   Levemir FlexPen 100 UNIT/ML FlexPen Generic drug: insulin detemir Inject 10 Units into the skin every evening.   lisinopril 10 MG tablet Commonly known as: ZESTRIL Take 10 mg by mouth daily.   METAMUCIL FIBER PO Take 1 Scoop by mouth every evening.   methocarbamol 750 MG tablet Commonly known as: Robaxin-750 Take 1 tablet (750 mg total) by mouth 4 (four) times daily.   nitroGLYCERIN 0.4 MG SL tablet Commonly known as: NITROSTAT Place 0.4 mg under the tongue every 5 (five) minutes as needed for chest pain.   omeprazole 40  MG capsule Commonly known as: PRILOSEC Take 40 mg by mouth in the morning and at bedtime.   oxyCODONE-acetaminophen 5-325 MG tablet Commonly known as: Percocet Take 1 tablet by mouth every 4 (four) hours as needed for severe pain.   pregabalin 50 MG capsule Commonly known as: LYRICA Take 50 mg by mouth 2 (two) times daily as needed (neuropathy).   PRESERVISION AREDS 2 PO Take 1 tablet by mouth in the morning and at bedtime.   ranolazine 500 MG 12 hr tablet Commonly known as: RANEXA Take 500 mg by mouth 2 (two) times daily.   sildenafil 20 MG tablet Commonly known as: REVATIO Take 2-5 tablets by mouth as needed (sexual disfunction).   traZODone 50 MG tablet Commonly known as: DESYREL Take 100 mg by mouth at bedtime.        Disposition: home   Final Dx: ALIF L5-S1, replacement of cages, posterior lateral fusion with replacement of posterior  hardware  Discharge Instructions      Remove dressing in 72 hours   Complete by: As directed    Call MD for:   Complete by: As directed    Call MD for:  difficulty breathing, headache or visual disturbances   Complete by: As directed    Call MD for:  hives   Complete by: As directed    Call MD for:  persistant nausea and vomiting   Complete by: As directed    Call MD for:  redness, tenderness, or signs of infection (pain, swelling, redness, odor or green/yellow discharge around incision site)   Complete by: As directed    Call MD for:  severe uncontrolled pain   Complete by: As directed    Call MD for:  temperature >100.4   Complete by: As directed    Diet - low sodium heart healthy   Complete by: As directed    Driving Restrictions   Complete by: As directed    No driving for 2 weeks, no riding in the car for 1 week   Increase activity slowly   Complete by: As directed    Lifting restrictions   Complete by: As directed    No lifting more than 8 lbs          Signed: Tiana Loft  04/17/2023, 7:36 AM

## 2023-04-17 NOTE — Progress Notes (Signed)
Occupational Therapy Treatment and Discharge Patient Details Name: Adam Roach MRN: [id number] DOB: Jul 02, 1956 Today's Date: 04/17/2023   History of present illness 67 yo M adm 7/16 for scheduled ALIF during proedure there was a tear and in the left iliac vein and there was an extensive mount of blood loss so 2nd part of procedure was aborted.7/18 second part of procedure with reexploration of fusion with removal of hardware L5-S1 removal of bilateral loose S1 screws replacement with bilateral alar screws and redo posterior lateral arthrodesis L5-S1  PMH includes: PLIF, CAD, T2DM, HTN.   OT comments  This 67 yo male seen today post 2nd procedure back surgery. Pt is doing well and is at a Mod I level for basic ADLs and will be staying at girlfriend's house so she can A him prn. Pt will decide while at girlfriend's house whether or not he will get a toilet riser or toilet handles before he goes back to his house. He has a 3n1 but it will not fit over his toilet at home due to space. No further OT needs, we will sign off.   Recommendations for follow up therapy are one component of a multi-disciplinary discharge planning process, led by the attending physician.  Recommendations may be updated based on patient status, additional functional criteria and insurance authorization.    Assistance Recommended at Discharge PRN  Patient can return home with the following  Assistance with cooking/housework;Assist for transportation   Equipment Recommendations  None recommended by OT       Precautions / Restrictions Precautions Precautions: Back Precaution Booklet Issued: Yes (comment) Required Braces or Orthoses: Spinal Brace Spinal Brace: Lumbar corset;Applied in sitting position;Applied in standing position Restrictions Weight Bearing Restrictions: No       Mobility Bed Mobility               General bed mobility comments: Pt up in bathroom upon my arrival    Transfers Overall  transfer level: Modified independent Equipment used: None                     Balance Overall balance assessment: Independent                                         ADL either performed or assessed with clinical judgement   ADL                                         General ADL Comments: Pt seen post 2nd part of procedure and doing well. Pt is completely aware of his back precautions from prior session a couple of days ago; but did need the reminder to use a cup to spit in instead of bending over the sink. He is able to get himself dressed by crossing one leg over the other in a "figure 4" position to get to his feet. Pt donned underwear and feels this will give him enough support for his scrotal edema as opposed to the scrotal sling.    Extremity/Trunk Assessment Upper Extremity Assessment Upper Extremity Assessment: Overall WFL for tasks assessed            Vision Patient Visual Report: No change from baseline            Cognition Arousal/Alertness: Awake/alert Behavior  During Therapy: WFL for tasks assessed/performed Overall Cognitive Status: Within Functional Limits for tasks assessed                                                     Pertinent Vitals/ Pain       Pain Assessment Pain Score: 2  Pain Location: incisonal Pain Descriptors / Indicators: Sore Pain Intervention(s): Limited activity within patient's tolerance, Monitored during session  Home Living Family/patient expects to be discharged to:: Private residence                                            Frequency  Min 1X/week        Progress Toward Goals  OT Goals(current goals can now be found in the care plan section)  Progress towards OT goals: Goals met/education completed, patient discharged from OT  Acute Rehab OT Goals Patient Stated Goal: to go home today  Plan Discharge plan remains appropriate        AM-PAC OT "6 Clicks" Daily Activity     Outcome Measure   Help from another person eating meals?: None Help from another person taking care of personal grooming?: None Help from another person toileting, which includes using toliet, bedpan, or urinal?: None Help from another person bathing (including washing, rinsing, drying)?: None Help from another person to put on and taking off regular upper body clothing?: None Help from another person to put on and taking off regular lower body clothing?: None 6 Click Score: 24    End of Session    OT Visit Diagnosis: Other abnormalities of gait and mobility (R26.89);Pain Pain - part of body:  (incisional)   Activity Tolerance Patient tolerated treatment well   Patient Left  (in bathroom shaving)   Nurse Communication  (no further OT needs)        Time: 4132-4401 OT Time Calculation (min): 27 min  Charges: OT General Charges $OT Visit: 1 Visit OT Treatments $Self Care/Home Management : 23-37 mins  Lindon Romp OT Acute Rehabilitation Services Office [phone]    Evette Georges 04/17/2023, 9:19 AM

## 2023-04-18 ENCOUNTER — Encounter (HOSPITAL_COMMUNITY): Payer: Self-pay | Admitting: Neurosurgery

## 2023-04-22 MED FILL — Heparin Sodium (Porcine) Inj 1000 Unit/ML: INTRAMUSCULAR | Qty: 30 | Status: AC

## 2023-04-22 MED FILL — Sodium Chloride IV Soln 0.9%: INTRAVENOUS | Qty: 2000 | Status: AC

## 2023-06-03 ENCOUNTER — Ambulatory Visit (HOSPITAL_COMMUNITY)
Admission: RE | Admit: 2023-06-03 | Discharge: 2023-06-03 | Disposition: A | Discharge status: Home / Self Care | Payer: Medicare Other | Source: Ambulatory Visit | Attending: Vascular Surgery | Admitting: Vascular Surgery

## 2023-06-03 ENCOUNTER — Other Ambulatory Visit (HOSPITAL_COMMUNITY): Payer: Self-pay | Admitting: Neurosurgery

## 2023-06-03 DIAGNOSIS — R52 Pain, unspecified: Secondary | ICD-10-CM | POA: Diagnosis present

## 2023-07-08 ENCOUNTER — Other Ambulatory Visit: Payer: Self-pay | Admitting: *Deleted

## 2023-07-08 DIAGNOSIS — R609 Edema, unspecified: Secondary | ICD-10-CM

## 2023-07-11 ENCOUNTER — Ambulatory Visit: Payer: Medicare Other | Admitting: Physician Assistant

## 2023-07-11 ENCOUNTER — Ambulatory Visit (HOSPITAL_COMMUNITY)
Admission: RE | Admit: 2023-07-11 | Discharge: 2023-07-11 | Disposition: A | Discharge status: Home / Self Care | Payer: Medicare Other | Source: Ambulatory Visit | Attending: Vascular Surgery | Admitting: Vascular Surgery

## 2023-07-11 ENCOUNTER — Encounter: Payer: Self-pay | Admitting: Physician Assistant

## 2023-07-11 VITALS — BP 138/83 | HR 64 | Temp 98.0°F | Wt 176.2 lb

## 2023-07-11 DIAGNOSIS — R609 Edema, unspecified: Secondary | ICD-10-CM | POA: Insufficient documentation

## 2023-07-11 DIAGNOSIS — M7989 Other specified soft tissue disorders: Secondary | ICD-10-CM

## 2023-07-11 NOTE — Progress Notes (Signed)
VASCULAR & VEIN SPECIALISTS           OF [place]  History and Physical   Adam Roach is a 67 y.o. male who presents with right leg swelling.   He underwent ALIF exposure by Dr. Chestine Spore on 04/14/2023 and it was complicated by left iliac vein injury that was repaired primarily.    He states that he has swelling in the right leg more than the left.  It is better in the mornings after laying flat.  He states he did wear some knee high compression but then he swelled up above the sock.     The pt is on a statin for cholesterol management.  The pt is on a daily aspirin.   Other AC:  none The pt is on ACEI, BB for hypertension.   The pt is  on medication for diabetes.   Tobacco hx:  never  Pt does not have family hx of AAA.  Past Medical History:  Diagnosis Date   Arthritis    Cancer [facility]) 2012   Right clear cell renal cancer, s/p right partial nephrectomy 08/17/11   Coronary artery disease    inferior MI, s/p RCA stent 2002; DES ostimal ramus 08/19/19   Diabetes mellitus without complication (HCC)    Type 2   Dysrhythmia    afib, s/p ablation 09/14/20, no known recurrence (08/08/22)   GERD (gastroesophageal reflux disease)    Hyperlipemia    Hypertension    Low back pain    Myocardial infarction [facility]) 2002   RCA stent [facility])   Wears glasses     Past Surgical History:  Procedure Laterality Date   ABDOMINAL EXPOSURE N/A 04/14/2023   Procedure: ABDOMINAL EXPOSURE;  Surgeon: Cephus Shelling, MD;  Location: [facility] OR;  Service: Vascular;  Laterality: N/A;   ANTERIOR LUMBAR FUSION N/A 04/14/2023   Procedure: ANTERIOR LUMBAR INTERBODY FUSION - LUMBAR FIVE-SACRAL ONE with removal of hardware;  Surgeon: Donalee Citrin, MD;  Location: [facility] OR;  Service: Neurosurgery;  Laterality: N/A;   CARDIAC CATHETERIZATION     RCA stent 2002; DES ostial ramus 08/19/19   CARPAL TUNNEL RELEASE Right 12/20/2014   Procedure: RIGHT CARPAL TUNNEL RELEASE;  Surgeon: Betha Loa, MD;  Location: [facility];  Service: Orthopedics;  Laterality: Right;   ELBOW ARTHROSCOPY  09/30/2008   left   FINGER GANGLION CYST EXCISION     rt thumb   GANGLION CYST EXCISION     lt wrist   GANGLION CYST EXCISION Right 12/20/2014   Procedure: REMOVAL GANGLION OF WRIST;  Surgeon: Betha Loa, MD;  Location: [facility];  Service: Orthopedics;  Laterality: Right;   LAMINECTOMY WITH POSTERIOR LATERAL ARTHRODESIS LEVEL 1 N/A 04/16/2023   Procedure: Lumbar Five-Sacral One Posterior revision of pedicle screws and posterolateral arthrodesis;  Surgeon: Donalee Citrin, MD;  Location: [facility] OR;  Service: Neurosurgery;  Laterality: N/A;   PARTIAL NEPHRECTOMY  08/27/2011   right   POSTERIOR LUMBAR FUSION 4 LEVEL N/A 08/16/2022   Procedure: LUMBAR TWO-THREE, LUMBAR THREE-FOUR, LUMBAR FOUR-FIVE, LUMBAR FIVE-SACRAL ONE POSTERIOR LUMBAR INTERBODY FUSION;  Surgeon: Tia Alert, MD;  Location: [facility] OR;  Service: Neurosurgery;  Laterality: N/A;   SHOULDER ARTHROSCOPY  10/01/2007   left   TONSILLECTOMY     ULNAR NERVE REPAIR  09/30/2010   left   ULNAR NERVE TRANSPOSITION Right 12/20/2014   Procedure: RIGHT ULNAR NERVE DECOMPRESSION, TRANSPOSITION;  Surgeon: Betha Loa, MD;  Location: [facility];  Service: Orthopedics;  Laterality: Right;    Social History   Socioeconomic History   Marital status: Widowed    Spouse name: Heidi   Number of children: 5   Years of education: 12   Highest education level: Not on file  Occupational History    Comment: [employer]  Tobacco Use   Smoking status: Never   Smokeless tobacco: Never  Vaping Use   Vaping status: Never Used  Substance and Sexual Activity   Alcohol use: No    Comment: last 2013   Drug use: No    Comment: used to use marajuana-last 2012   Sexual activity: Not Currently  Other Topics Concern   Not on file  Social History Narrative   Lives with wife who has stage 4 lung cancer    Caffeine use- coffee- 2-3 cups daily   Social Determinants of Health   Financial Resource Strain: Not on file  Food Insecurity: Low Risk  (04/22/2023)   [facility]   Hunger Vital Sign    Worried About Running Out of Food in the Last Year: Never true    Ran Out of Food in the Last Year: Never true  Transportation Needs: Not on file (04/22/2023)  Physical Activity: Not on file  Stress: Not on file  Social Connections: Not on file  Intimate Partner Violence: Not on file     Family History  Problem Relation Age of Onset   Heart disease Father     Current Outpatient Medications  Medication Sig Dispense Refill   aspirin 81 MG tablet Take 81 mg by mouth daily.     atenolol (TENORMIN) 50 MG tablet Take 50 mg by mouth every evening.     atorvastatin (LIPITOR) 80 MG tablet Take 80 mg by mouth daily.     B-D UF III MINI PEN NEEDLES 31G X 5 MM MISC SMARTSIG:Injection Daily     cyanocobalamin (VITAMIN B12) 1000 MCG tablet Take 1,000 mcg by mouth daily.     cyclobenzaprine (FLEXERIL) 10 MG tablet Take 1 tablet (10 mg total) by mouth 3 (three) times daily as needed for muscle spasms. 30 tablet 0   ferrous sulfate 325 (65 FE) MG tablet Take 325 mg by mouth daily with breakfast.     LEVEMIR FLEXPEN 100 UNIT/ML FlexPen Inject 10 Units into the skin every evening.     lisinopril (ZESTRIL) 10 MG tablet Take 10 mg by mouth daily.     magnesium oxide (MAG-OX) 400 MG tablet Take 400 mg by mouth daily.     METAMUCIL FIBER PO Take 1 Scoop by mouth every evening.     methocarbamol (ROBAXIN-750) 750 MG tablet Take 1 tablet (750 mg total) by mouth 4 (four) times daily. 45 tablet 0   Multiple Vitamins-Minerals (PRESERVISION AREDS 2 PO) Take 1 tablet by mouth in the morning and at bedtime.     nitroGLYCERIN (NITROSTAT) 0.4 MG SL tablet Place 0.4 mg under the tongue every 5 (five) minutes as needed for chest pain.     omeprazole (PRILOSEC) 40 MG capsule Take 40 mg by mouth in  the morning and at bedtime.     pregabalin (LYRICA) 50 MG capsule Take 50 mg by mouth 2 (two) times daily as needed (neuropathy).     ranolazine (RANEXA) 500 MG 12 hr tablet Take 500 mg by mouth 2 (two) times daily.     sildenafil (REVATIO) 20 MG tablet Take 2-5 tablets by  mouth as needed (sexual disfunction).     traZODone (DESYREL) 50 MG tablet Take 100 mg by mouth at bedtime.  3   oxyCODONE-acetaminophen (PERCOCET) 5-325 MG tablet Take 1 tablet by mouth every 4 (four) hours as needed for severe pain. 20 tablet 0   No current facility-administered medications for this visit.    Allergies  Allergen Reactions   Ezetimibe Shortness Of Breath    Chest pain and breathing issues   Penicillins Anaphylaxis   Vancomycin Rash and Anaphylaxis   Ciprofloxacin In D5w Rash   Contrast Media [Iodinated Contrast Media] Hives   Ioxaglate Hives   Aspirin Other (See Comments)    At full strength, stomach irritation   Ciprofloxacin Rash   Evolocumab Hives and Rash   Gabapentin Hives and Rash   Ibuprofen Other (See Comments)    Stomach irritation     REVIEW OF SYSTEMS:   [X]  denotes positive finding, [ ]  denotes negative finding Cardiac  Comments:  Chest pain or chest pressure:    Shortness of breath upon exertion:    Short of breath when lying flat:    Irregular heart rhythm:        Vascular    Pain in calf, thigh, or hip brought on by ambulation:    Pain in feet at night that wakes you up from your sleep:     Blood clot in your veins:    Leg swelling:  x       Pulmonary    Oxygen at home:    Productive cough:     Wheezing:         Neurologic    Sudden weakness in arms or legs:     Sudden numbness in arms or legs:     Sudden onset of difficulty speaking or slurred speech:    Temporary loss of vision in one eye:     Problems with dizziness:         Gastrointestinal    Blood in stool:     Vomited blood:         Genitourinary    Burning when urinating:     Blood in urine:         Psychiatric    Major depression:         Hematologic    Bleeding problems:    Problems with blood clotting too easily:        Skin    Rashes or ulcers:        Constitutional    Fever or chills:      PHYSICAL EXAMINATION:  Today's Vitals   07/11/23 1417  BP: 138/83  Pulse: 64  Temp: 98 F (36.7 C)  TempSrc: Temporal  SpO2: 95%  Weight: 176 lb 3.2 oz (79.9 kg)   Body mass index is 29.32 kg/m.   General:  WDWN in NAD; vital signs documented above Gait: Not observed HENT: WNL, normocephalic Pulmonary: normal non-labored breathing without wheezing Cardiac: regular HR; without carotid bruits Abdomen: soft, NT, aortic pulse is not palpable Skin: without rashes Vascular Exam/Pulses:  Right Left  Radial 2+ (normal) 2+ (normal)  DP 2+ (normal) 2+ (normal)   Extremities: BLE swelling with right worse than left.   Neurologic: A&O X 3;  moving all extremities equally Psychiatric:  The pt has Normal affect.   Non-Invasive Vascular Imaging:   Venous duplex on 07/11/2023: +--------------+---------+------+-----------+------------+--------+  RIGHT        Reflux NoRefluxReflux TimeDiameter cmsComments  Yes                                   +--------------+---------+------+-----------+------------+--------+  CFV                    yes   >1 second                       +--------------+---------+------+-----------+------------+--------+  FV mid        no                                              +--------------+---------+------+-----------+------------+--------+  Popliteal    no                                              +--------------+---------+------+-----------+------------+--------+  GSV at [facility]    no                            0.50              +--------------+---------+------+-----------+------------+--------+  GSV prox thighno                            0.35               +--------------+---------+------+-----------+------------+--------+  GSV mid thigh no                            0.30              +--------------+---------+------+-----------+------------+--------+  GSV dist thighno                            0.28              +--------------+---------+------+-----------+------------+--------+  GSV at knee   no                            0.37              +--------------+---------+------+-----------+------------+--------+  GSV prox calf no                            0.3               +--------------+---------+------+-----------+------------+--------+  SSV Pop Fossa no                            0.63              +--------------+---------+------+-----------+------------+--------+  SSV prox calf no                            0.39              +--------------+---------+------+-----------+------------+--------+   Summary:  Right:  - No evidence of deep vein thrombosis seen in the right lower extremity,  from the common femoral through  the popliteal veins.  - No evidence of superficial venous thrombosis in the right lower  extremity.  - The deep venous system is incompetent at the common femoral vein.  - The great saphenous vein is competent.  - The small saphenous vein is competent.     Arias Weinert is a 67 y.o. male who presents with hx of ALIF exposure by Dr. Chestine Spore on 04/14/2023 and it was complicated by left iliac vein injury that was repaired primarily now with BLE swelling with right worse than left    -pt has easily palpable pedal pulses bilaterally -pt does not have evidence of DVT.  Pt does have venous reflux in the right CFV but no other reflux. -discussed with pt about wearing thigh high 20-30 mmHg compression stockings and pt was measured for these today.   Hopefully, he will get more relief with this instead of the knee high.  Discussed to put them on in the am before getting out of bed and taking  them off at night.  -discussed the importance of leg elevation and how to elevate properly - pt is advised to elevate their legs  but given his recent back surgery, he will elevate as he can. -pt is advised to stay as active as possible.   -discussed importance of weight loss and exercise and that water aerobics would also be beneficial.  -he states he was having some tenderness around his left lower abdominal incision but neither he nor I could reproduce this pain.  He will call if he continues to have issues.  His incision was healed nicely.  -pt will f/u as needed   Doreatha Massed, [facility] Vascular [facility] Vein Specialists [phone]  Clinic MD:  Hetty Blend on call MD

## 2023-09-05 ENCOUNTER — Other Ambulatory Visit (HOSPITAL_BASED_OUTPATIENT_CLINIC_OR_DEPARTMENT_OTHER): Payer: Self-pay | Admitting: Neurosurgery

## 2023-09-05 DIAGNOSIS — M5440 Lumbago with sciatica, unspecified side: Secondary | ICD-10-CM

## 2023-09-09 ENCOUNTER — Ambulatory Visit (HOSPITAL_BASED_OUTPATIENT_CLINIC_OR_DEPARTMENT_OTHER)
Admission: RE | Admit: 2023-09-09 | Discharge: 2023-09-09 | Disposition: A | Discharge status: Home / Self Care | Payer: Medicare Other | Source: Ambulatory Visit | Attending: Neurosurgery | Admitting: Neurosurgery

## 2023-09-09 DIAGNOSIS — M5440 Lumbago with sciatica, unspecified side: Secondary | ICD-10-CM | POA: Diagnosis present

## 2024-02-10 ENCOUNTER — Other Ambulatory Visit (HOSPITAL_BASED_OUTPATIENT_CLINIC_OR_DEPARTMENT_OTHER): Payer: Self-pay | Admitting: Neurosurgery

## 2024-02-10 DIAGNOSIS — M5440 Lumbago with sciatica, unspecified side: Secondary | ICD-10-CM

## 2024-02-11 ENCOUNTER — Ambulatory Visit (HOSPITAL_BASED_OUTPATIENT_CLINIC_OR_DEPARTMENT_OTHER)
Admission: RE | Admit: 2024-02-11 | Discharge: 2024-02-11 | Disposition: A | Discharge status: Home / Self Care | Source: Ambulatory Visit | Attending: Neurosurgery | Admitting: Neurosurgery

## 2024-02-11 DIAGNOSIS — M5440 Lumbago with sciatica, unspecified side: Secondary | ICD-10-CM | POA: Insufficient documentation

## 2024-03-12 ENCOUNTER — Ambulatory Visit (HOSPITAL_COMMUNITY)
Admission: RE | Admit: 2024-03-12 | Discharge: 2024-03-12 | Disposition: A | Discharge status: Home / Self Care | Source: Ambulatory Visit | Attending: Vascular Surgery

## 2024-03-12 ENCOUNTER — Other Ambulatory Visit (HOSPITAL_COMMUNITY): Payer: Self-pay | Admitting: Neurosurgery

## 2024-03-12 DIAGNOSIS — I739 Peripheral vascular disease, unspecified: Secondary | ICD-10-CM

## 2024-03-12 LAB — VAS US ABI WITH/WO TBI: Right ABI: 0.97

## 2024-04-16 ENCOUNTER — Other Ambulatory Visit (HOSPITAL_BASED_OUTPATIENT_CLINIC_OR_DEPARTMENT_OTHER): Payer: Self-pay | Admitting: Neurosurgery

## 2024-04-16 DIAGNOSIS — M5440 Lumbago with sciatica, unspecified side: Secondary | ICD-10-CM

## 2024-04-19 ENCOUNTER — Other Ambulatory Visit (HOSPITAL_BASED_OUTPATIENT_CLINIC_OR_DEPARTMENT_OTHER): Payer: Self-pay | Admitting: Neurosurgery

## 2024-04-19 DIAGNOSIS — M25552 Pain in left hip: Secondary | ICD-10-CM

## 2024-04-19 DIAGNOSIS — M5440 Lumbago with sciatica, unspecified side: Secondary | ICD-10-CM

## 2024-04-21 ENCOUNTER — Ambulatory Visit (HOSPITAL_BASED_OUTPATIENT_CLINIC_OR_DEPARTMENT_OTHER)
Admission: RE | Admit: 2024-04-21 | Discharge: 2024-04-21 | Disposition: A | Discharge status: Home / Self Care | Source: Ambulatory Visit | Attending: Neurosurgery | Admitting: Neurosurgery

## 2024-04-21 DIAGNOSIS — M25552 Pain in left hip: Secondary | ICD-10-CM

## 2024-04-21 DIAGNOSIS — M5440 Lumbago with sciatica, unspecified side: Secondary | ICD-10-CM

## 2024-05-10 ENCOUNTER — Other Ambulatory Visit: Payer: Self-pay | Admitting: Neurosurgery

## 2024-05-24 NOTE — Interval H&P Note (Signed)
 H&P reviewed. The patient was examined and there are no changes to the H&P.

## 2024-05-25 NOTE — Pre-Procedure Instructions (Signed)
 Surgical Instructions   Your procedure is scheduled on June 02, 2024. Report to [facility] Main Entrance A at 9:00 A.M., then check in with the Admitting office. Any questions or running late day of surgery: call [phone]  Questions prior to your surgery date: call [phone], Monday-Friday, 8am-4pm. If you experience any cold or flu symptoms such as cough, fever, chills, shortness of breath, etc. between now and your scheduled surgery, please notify us  at the above number.     Remember:  Do not eat after midnight the night before your surgery  You may drink clear liquids until 8:00 AM the morning of your surgery.   Clear liquids allowed are: Water, Non-Citrus Juices (without pulp), Carbonated Beverages, Clear Tea (no milk, honey, etc.), Black Coffee Only (NO MILK, CREAM OR POWDERED CREAMER of any kind), and Gatorade.    Take these medicines the morning of surgery with A SIP OF WATER: atorvastatin  (LIPITOR)  omeprazole (PRILOSEC)  ranolazine  (RANEXA )    May take these medicines IF NEEDED: nitroGLYCERIN  (NITROSTAT ) - if dose taken prior to surgery, please call [phone]   Follow your surgeon's instructions on when to stop Aspirin .  If no instructions were given by your surgeon then you will need to call the office to get those instructions.     One week prior to surgery, STOP taking any Aleve, Naproxen, Ibuprofen, Motrin, Advil, Goody's, BC's, all herbal medications, fish oil, and non-prescription vitamins.   WHAT DO I DO ABOUT MY DIABETES MEDICATION?   THE NIGHT BEFORE SURGERY, take 6 units of insulin  glargine (LANTUS).       HOW TO MANAGE YOUR DIABETES BEFORE AND AFTER SURGERY  Why is it important to control my blood sugar before and after surgery? Improving blood sugar levels before and after surgery helps healing and can limit problems. A way of improving blood sugar control is eating a healthy diet by:  Eating less sugar and carbohydrates  Increasing  activity/exercise  Talking with your doctor about reaching your blood sugar goals High blood sugars (greater than 180 mg/dL) can raise your risk of infections and slow your recovery, so you will need to focus on controlling your diabetes during the weeks before surgery. Make sure that the doctor who takes care of your diabetes knows about your planned surgery including the date and location.  How do I manage my blood sugar before surgery? Check your blood sugar at least 4 times a day, starting 2 days before surgery, to make sure that the level is not too high or low.  Check your blood sugar the morning of your surgery when you wake up and every 2 hours until you get to the Short Stay unit.  If your blood sugar is less than 70 mg/dL, you will need to treat for low blood sugar: Do not take insulin . Treat a low blood sugar (less than 70 mg/dL) with  cup of clear juice (cranberry or apple), 4 glucose tablets, OR glucose gel. Recheck blood sugar in 15 minutes after treatment (to make sure it is greater than 70 mg/dL). If your blood sugar is not greater than 70 mg/dL on recheck, call [phone] for further instructions. Report your blood sugar to the short stay nurse when you get to Short Stay.  If you are admitted to the hospital after surgery: Your blood sugar will be checked by the staff and you will probably be given insulin  after surgery (instead of oral diabetes medicines) to make sure you have good blood sugar levels.  The goal for blood sugar control after surgery is 80-180 mg/dL.                      Do NOT Smoke (Tobacco/Vaping) for 24 hours prior to your procedure.  If you use a CPAP at night, you may bring your mask/headgear for your overnight stay.   You will be asked to remove any contacts, glasses, piercing's, hearing aid's, dentures/partials prior to surgery. Please bring cases for these items if needed.    Patients discharged the day of surgery will not be allowed to drive  home, and someone needs to stay with them for 24 hours.  SURGICAL WAITING ROOM VISITATION Patients may have no more than 2 support people in the waiting area - these visitors may rotate.   Pre-op nurse will coordinate an appropriate time for 1 ADULT support person, who may not rotate, to accompany patient in pre-op.  Children under the age of 43 must have an adult with them who is not the patient and must remain in the main waiting area with an adult.  If the patient needs to stay at the hospital during part of their recovery, the visitor guidelines for inpatient rooms apply.  Please refer to the [facility] website for the visitor guidelines for any additional information.   If you received a COVID test during your pre-op visit  it is requested that you wear a mask when out in public, stay away from anyone that may not be feeling well and notify your surgeon if you develop symptoms. If you have been in contact with anyone that has tested positive in the last 10 days please notify you surgeon.      Pre-operative 5 CHG Bathing Instructions   You can play a key role in reducing the risk of infection after surgery. Your skin needs to be as free of germs as possible. You can reduce the number of germs on your skin by washing with CHG (chlorhexidine  gluconate) soap before surgery. CHG is an antiseptic soap that kills germs and continues to kill germs even after washing.   DO NOT use if you have an allergy to chlorhexidine /CHG or antibacterial soaps. If your skin becomes reddened or irritated, stop using the CHG and notify one of our RNs at [phone].   Please shower with the CHG soap starting 4 days before surgery using the following schedule:     Please keep in mind the following:  DO NOT shave, including legs and underarms, starting the day of your first shower.   You may shave your face at any point before/day of surgery.  Place clean sheets on your bed the day you start using CHG  soap. Use a clean washcloth (not used since being washed) for each shower. DO NOT sleep with pets once you start using the CHG.   CHG Shower Instructions:  Wash your face and private area with normal soap. If you choose to wash your hair, wash first with your normal shampoo.  After you use shampoo/soap, rinse your hair and body thoroughly to remove shampoo/soap residue.  Turn the water OFF and apply about 3 tablespoons (45 ml) of CHG soap to a CLEAN washcloth.  Apply CHG soap ONLY FROM YOUR NECK DOWN TO YOUR TOES (washing for 3-5 minutes)  DO NOT use CHG soap on face, private areas, open wounds, or sores.  Pay special attention to the area where your surgery is being performed.  If you are having back  surgery, having someone wash your back for you may be helpful. Wait 2 minutes after CHG soap is applied, then you may rinse off the CHG soap.  Pat dry with a clean towel  Put on clean clothes/pajamas   If you choose to wear lotion, please use ONLY the CHG-compatible lotions that are listed below.  Additional instructions for the day of surgery: DO NOT APPLY any lotions, deodorants, cologne, or perfumes.   Do not bring valuables to the hospital. [facility] is not responsible for any belongings/valuables. Do not wear nail polish, gel polish, artificial nails, or any other type of covering on natural nails (fingers and toes) Do not wear jewelry or makeup Put on clean/comfortable clothes.  Please brush your teeth.  Ask your nurse before applying any prescription medications to the skin.     CHG Compatible Lotions   Aveeno Moisturizing lotion  Cetaphil Moisturizing Cream  Cetaphil Moisturizing Lotion  Clairol Herbal Essence Moisturizing Lotion, Dry Skin  Clairol Herbal Essence Moisturizing Lotion, Extra Dry Skin  Clairol Herbal Essence Moisturizing Lotion, Normal Skin  Curel Age Defying Therapeutic Moisturizing Lotion with Alpha Hydroxy  Curel Extreme Care Body Lotion  Curel Soothing  Hands Moisturizing Hand Lotion  Curel Therapeutic Moisturizing Cream, Fragrance-Free  Curel Therapeutic Moisturizing Lotion, Fragrance-Free  Curel Therapeutic Moisturizing Lotion, Original Formula  Eucerin Daily Replenishing Lotion  Eucerin Dry Skin Therapy Plus Alpha Hydroxy Crme  Eucerin Dry Skin Therapy Plus Alpha Hydroxy Lotion  Eucerin Original Crme  Eucerin Original Lotion  Eucerin Plus Crme Eucerin Plus Lotion  Eucerin TriLipid Replenishing Lotion  Keri Anti-Bacterial Hand Lotion  Keri Deep Conditioning Original Lotion Dry Skin Formula Softly Scented  Keri Deep Conditioning Original Lotion, Fragrance Free Sensitive Skin Formula  Keri Lotion Fast Absorbing Fragrance Free Sensitive Skin Formula  Keri Lotion Fast Absorbing Softly Scented Dry Skin Formula  Keri Original Lotion  Keri Skin Renewal Lotion Keri Silky Smooth Lotion  Keri Silky Smooth Sensitive Skin Lotion  Nivea Body Creamy Conditioning Oil  Nivea Body Extra Enriched Lotion  Nivea Body Original Lotion  Nivea Body Sheer Moisturizing Lotion Nivea Crme  Nivea Skin Firming Lotion  NutraDerm 30 Skin Lotion  NutraDerm Skin Lotion  NutraDerm Therapeutic Skin Cream  NutraDerm Therapeutic Skin Lotion  ProShield Protective Hand Cream  Provon moisturizing lotion  Please read over the following fact sheets that you were given.

## 2024-05-26 ENCOUNTER — Encounter (HOSPITAL_COMMUNITY)
Admission: RE | Admit: 2024-05-26 | Discharge: 2024-05-26 | Disposition: A | Discharge status: Home / Self Care | Source: Ambulatory Visit | Attending: Neurosurgery | Admitting: Neurosurgery

## 2024-05-26 ENCOUNTER — Other Ambulatory Visit: Payer: Self-pay

## 2024-05-26 ENCOUNTER — Encounter (HOSPITAL_COMMUNITY): Payer: Self-pay

## 2024-05-26 VITALS — BP 115/68 | HR 45 | Temp 97.9°F | Resp 18 | Ht 65.0 in | Wt 156.3 lb

## 2024-05-26 DIAGNOSIS — Z01812 Encounter for preprocedural laboratory examination: Secondary | ICD-10-CM | POA: Insufficient documentation

## 2024-05-26 DIAGNOSIS — Z01818 Encounter for other preprocedural examination: Secondary | ICD-10-CM

## 2024-05-26 LAB — BASIC METABOLIC PANEL WITH GFR
Anion gap: 6 (ref 5–15)
BUN: 18 mg/dL (ref 8–23)
CO2: 23 mmol/L (ref 22–32)
Calcium: 9.1 mg/dL (ref 8.9–10.3)
Chloride: 104 mmol/L (ref 98–111)
Creatinine, Ser: 0.8 mg/dL (ref 0.61–1.24)
GFR, Estimated: >60 mL/min (ref >60)
Glucose, Bld: 98 mg/dL (ref 70–99)
Potassium: 4.3 mmol/L (ref 3.5–5.1)
Sodium: 133 mmol/L — ABNORMAL LOW (ref 135–145)

## 2024-05-26 LAB — CBC
HCT: 43.3 % (ref 39.0–52.0)
Hemoglobin: 14 g/dL (ref 13.0–17.0)
MCH: 29.3 pg (ref 26.0–34.0)
MCHC: 32.3 g/dL (ref 30.0–36.0)
MCV: 90.6 fL (ref 80.0–100.0)
Platelets: 211 K/uL (ref 150–400)
RBC: 4.78 MIL/uL (ref 4.22–5.81)
RDW: 13.5 % (ref 11.5–15.5)
WBC: 6.2 K/uL (ref 4.0–10.5)
nRBC: 0 % (ref 0.0–0.2)

## 2024-05-26 LAB — GLUCOSE, CAPILLARY: Glucose-Capillary: 109 mg/dL — ABNORMAL HIGH (ref 70–99)

## 2024-05-26 LAB — SURGICAL PCR SCREEN
MRSA, PCR: NEGATIVE
Staphylococcus aureus: POSITIVE — AB

## 2024-05-26 NOTE — Progress Notes (Signed)
 PCP -  Dr. Darnelle Lefevre Cardiologist - Dr. Debby Manner LOV 03-11-24; F/U in 6 months  PPM/ICD - Denies Device Orders - n/a Rep Notified - n/a  Chest x-ray - n/a EKG - 12-16-23 Stress Test - 12-18-23 CE ECHO - 08-09-21 CE Cardiac Cath - 08-19-19 CE  Sleep Study - Denies CPAP - n/a  Fasting Blood Sugar - 100-120's Checks Blood Sugar Daily in the morning  Last dose of GLP1 agonist-  Denies GLP1 instructions: Denies  Blood Thinner Instructions: Denies Aspirin  Instructions: Per patient physician told his to stop taking on 05-25-24  ERAS Protcol - Clears until 0900 PRE-SURGERY Ensure or G2- None  COVID TEST- n/a   Anesthesia review: Yes, DM, a-fib, HTN, CAD-MI  Patient denies shortness of breath, fever, cough and chest pain at PAT appointment. Patient denies any respiratory issues at this time.    All instructions explained to the patient, with a verbal understanding of the material. Patient agrees to go over the instructions while at home for a better understanding. Patient also instructed to self quarantine after being tested for COVID-19. The opportunity to ask questions was provided.

## 2024-05-26 NOTE — Progress Notes (Signed)
 Surgical Instructions     Your procedure is scheduled on June 02, 2024. Report to [facility] Main Entrance A at 10:00 A.M., then check in with the Admitting office. Any questions or running late day of surgery: call [phone]   Questions prior to your surgery date: call [phone], Monday-Friday, 8am-4pm. If you experience any cold or flu symptoms such as cough, fever, chills, shortness of breath, etc. between now and your scheduled surgery, please notify us  at the above number.            Remember:       Do not eat after midnight the night before your surgery   You may drink clear liquids until 9:00 AM the morning of your surgery.   Clear liquids allowed are: Water, Non-Citrus Juices (without pulp), Carbonated Beverages, Clear Tea (no milk, honey, etc.), Black Coffee Only (NO MILK, CREAM OR POWDERED CREAMER of any kind), and Gatorade.          Take these medicines the morning of surgery with A SIP OF WATER: atorvastatin  (LIPITOR)  omeprazole (PRILOSEC)  ranolazine  (RANEXA )      May take these medicines IF NEEDED: nitroGLYCERIN  (NITROSTAT ) - if dose taken prior to surgery, please call [phone]     Follow your surgeon's instructions on when to stop Aspirin .  If no instructions were given by your surgeon then you will need to call the office to get those instructions.       One week prior to surgery, STOP taking any Aleve, Naproxen, Ibuprofen, Motrin, Advil, Goody's, BC's, all herbal medications, fish oil, and non-prescription vitamins.     WHAT DO I DO ABOUT MY DIABETES MEDICATION?     THE NIGHT BEFORE SURGERY, take 6 units of insulin  glargine (LANTUS).                                                    HOW TO MANAGE YOUR DIABETES BEFORE AND AFTER SURGERY   Why is it important to control my blood sugar before and after surgery? Improving blood sugar levels before and after surgery helps healing and can limit problems. A way of improving blood sugar control  is eating a healthy diet by:  Eating less sugar and carbohydrates  Increasing activity/exercise  Talking with your doctor about reaching your blood sugar goals High blood sugars (greater than 180 mg/dL) can raise your risk of infections and slow your recovery, so you will need to focus on controlling your diabetes during the weeks before surgery. Make sure that the doctor who takes care of your diabetes knows about your planned surgery including the date and location.   How do I manage my blood sugar before surgery? Check your blood sugar at least 4 times a day, starting 2 days before surgery, to make sure that the level is not too high or low.   Check your blood sugar the morning of your surgery when you wake up and every 2 hours until you get to the Short Stay unit.   If your blood sugar is less than 70 mg/dL, you will need to treat for low blood sugar: Do not take insulin . Treat a low blood sugar (less than 70 mg/dL) with  cup of clear juice (cranberry or apple), 4 glucose tablets, OR glucose gel. Recheck blood sugar in 15 minutes after treatment (to make  sure it is greater than 70 mg/dL). If your blood sugar is not greater than 70 mg/dL on recheck, call [phone] for further instructions. Report your blood sugar to the short stay nurse when you get to Short Stay.   If you are admitted to the hospital after surgery: Your blood sugar will be checked by the staff and you will probably be given insulin  after surgery (instead of oral diabetes medicines) to make sure you have good blood sugar levels. The goal for blood sugar control after surgery is 80-180 mg/dL.                       Do NOT Smoke (Tobacco/Vaping) for 24 hours prior to your procedure.   If you use a CPAP at night, you may bring your mask/headgear for your overnight stay.   You will be asked to remove any contacts, glasses, piercing's, hearing aid's, dentures/partials prior to surgery. Please bring cases for these items  if needed.    Patients discharged the day of surgery will not be allowed to drive home, and someone needs to stay with them for 24 hours.   SURGICAL WAITING ROOM VISITATION Patients may have no more than 2 support people in the waiting area - these visitors may rotate.   Pre-op nurse will coordinate an appropriate time for 1 ADULT support person, who may not rotate, to accompany patient in pre-op.  Children under the age of 60 must have an adult with them who is not the patient and must remain in the main waiting area with an adult.   If the patient needs to stay at the hospital during part of their recovery, the visitor guidelines for inpatient rooms apply.   Please refer to the [facility] website for the visitor guidelines for any additional information.     If you received a COVID test during your pre-op visit  it is requested that you wear a mask when out in public, stay away from anyone that may not be feeling well and notify your surgeon if you develop symptoms. If you have been in contact with anyone that has tested positive in the last 10 days please notify you surgeon.         Pre-operative 5 CHG Bathing Instructions    You can play a key role in reducing the risk of infection after surgery. Your skin needs to be as free of germs as possible. You can reduce the number of germs on your skin by washing with CHG (chlorhexidine  gluconate) soap before surgery. CHG is an antiseptic soap that kills germs and continues to kill germs even after washing.    DO NOT use if you have an allergy to chlorhexidine /CHG or antibacterial soaps. If your skin becomes reddened or irritated, stop using the CHG and notify one of our RNs at [phone].    Please shower with the CHG soap starting 4 days before surgery using the following schedule:       Please keep in mind the following:  DO NOT shave, including legs and underarms, starting the day of your first shower.   You may shave your face at  any point before/day of surgery.  Place clean sheets on your bed the day you start using CHG soap. Use a clean washcloth (not used since being washed) for each shower. DO NOT sleep with pets once you start using the CHG.    CHG Shower Instructions:  Oncologist and private area with  normal soap. If you choose to wash your hair, wash first with your normal shampoo.  After you use shampoo/soap, rinse your hair and body thoroughly to remove shampoo/soap residue.  Turn the water OFF and apply about 3 tablespoons (45 ml) of CHG soap to a CLEAN washcloth.  Apply CHG soap ONLY FROM YOUR NECK DOWN TO YOUR TOES (washing for 3-5 minutes)  DO NOT use CHG soap on face, private areas, open wounds, or sores.  Pay special attention to the area where your surgery is being performed.  If you are having back surgery, having someone wash your back for you may be helpful. Wait 2 minutes after CHG soap is applied, then you may rinse off the CHG soap.  Pat dry with a clean towel  Put on clean clothes/pajamas   If you choose to wear lotion, please use ONLY the CHG-compatible lotions that are listed below.   Additional instructions for the day of surgery: DO NOT APPLY any lotions, deodorants, cologne, or perfumes.   Do not bring valuables to the hospital. [facility] is not responsible for any belongings/valuables. Do not wear nail polish, gel polish, artificial nails, or any other type of covering on natural nails (fingers and toes) Do not wear jewelry or makeup Put on clean/comfortable clothes.  Please brush your teeth.  Ask your nurse before applying any prescription medications to the skin.        CHG Compatible Lotions    Aveeno Moisturizing lotion  Cetaphil Moisturizing Cream  Cetaphil Moisturizing Lotion  Clairol Herbal Essence Moisturizing Lotion, Dry Skin  Clairol Herbal Essence Moisturizing Lotion, Extra Dry Skin  Clairol Herbal Essence Moisturizing Lotion, Normal Skin  Curel Age Defying  Therapeutic Moisturizing Lotion with Alpha Hydroxy  Curel Extreme Care Body Lotion  Curel Soothing Hands Moisturizing Hand Lotion  Curel Therapeutic Moisturizing Cream, Fragrance-Free  Curel Therapeutic Moisturizing Lotion, Fragrance-Free  Curel Therapeutic Moisturizing Lotion, Original Formula  Eucerin Daily Replenishing Lotion  Eucerin Dry Skin Therapy Plus Alpha Hydroxy Crme  Eucerin Dry Skin Therapy Plus Alpha Hydroxy Lotion  Eucerin Original Crme  Eucerin Original Lotion  Eucerin Plus Crme Eucerin Plus Lotion  Eucerin TriLipid Replenishing Lotion  Keri Anti-Bacterial Hand Lotion  Keri Deep Conditioning Original Lotion Dry Skin Formula Softly Scented  Keri Deep Conditioning Original Lotion, Fragrance Free Sensitive Skin Formula  Keri Lotion Fast Absorbing Fragrance Free Sensitive Skin Formula  Keri Lotion Fast Absorbing Softly Scented Dry Skin Formula  Keri Original Lotion  Keri Skin Renewal Lotion Keri Silky Smooth Lotion  Keri Silky Smooth Sensitive Skin Lotion  Nivea Body Creamy Conditioning Oil  Nivea Body Extra Enriched Lotion  Nivea Body Original Lotion  Nivea Body Sheer Moisturizing Lotion Nivea Crme  Nivea Skin Firming Lotion  NutraDerm 30 Skin Lotion  NutraDerm Skin Lotion  NutraDerm Therapeutic Skin Cream  NutraDerm Therapeutic Skin Lotion  ProShield Protective Hand Cream  Provon moisturizing lotion   Please read over the following fact sheets that you were given.

## 2024-06-02 ENCOUNTER — Encounter (HOSPITAL_COMMUNITY): Admission: RE | Payer: Self-pay | Source: Home / Self Care

## 2024-06-02 ENCOUNTER — Inpatient Hospital Stay (HOSPITAL_COMMUNITY): Admission: RE | Admit: 2024-06-02 | Source: Home / Self Care | Admitting: Neurosurgery

## 2024-06-02 SURGERY — LAMINECTOMY WITH POSTERIOR LATERAL ARTHRODESIS LEVEL 1
Anesthesia: General | Site: Back

## 2024-06-24 NOTE — Pre-Procedure Instructions (Signed)
 Surgical Instructions   Your procedure is scheduled on June 30, 2024. Report to [facility] Main Entrance A at 9:00 A.M., then check in with the Admitting office. Any questions or running late day of surgery: call [phone]  Questions prior to your surgery date: call [phone], Monday-Friday, 8am-4pm. If you experience any cold or flu symptoms such as cough, fever, chills, shortness of breath, etc. between now and your scheduled surgery, please notify us  at the above number.     Remember:  Do not eat after midnight the night before your surgery  You may drink clear liquids until 8:00 AM the morning of your surgery.   Clear liquids allowed are: Water, Non-Citrus Juices (without pulp), Carbonated Beverages, Clear Tea (no milk, honey, etc.), Black Coffee Only (NO MILK, CREAM OR POWDERED CREAMER of any kind), and Gatorade.    Take these medicines the morning of surgery with A SIP OF WATER: atorvastatin  (LIPITOR)  JOURNAVX  omeprazole (PRILOSEC)  prednisoLONE acetate (PRED FORTE) eye drops ranolazine  (RANEXA )    May take these medicines IF NEEDED: nitroGLYCERIN  (NITROSTAT ) - if dose taken prior to surgery, please call [phone]   Follow your surgeon's instructions on when to stop Aspirin .  If no instructions were given by your surgeon then you will need to call the office to get those instructions.     One week prior to surgery, STOP taking any Aleve, Naproxen, Ibuprofen, Motrin, Advil, Goody's, BC's, all herbal medications, fish oil, and non-prescription vitamins.   WHAT DO I DO ABOUT MY DIABETES MEDICATION?   THE NIGHT BEFORE SURGERY, take 6 units of LANTUS SOLOSTAR insulin .       HOW TO MANAGE YOUR DIABETES BEFORE AND AFTER SURGERY  Why is it important to control my blood sugar before and after surgery? Improving blood sugar levels before and after surgery helps healing and can limit problems. A way of improving blood sugar control is eating a healthy diet  by:  Eating less sugar and carbohydrates  Increasing activity/exercise  Talking with your doctor about reaching your blood sugar goals High blood sugars (greater than 180 mg/dL) can raise your risk of infections and slow your recovery, so you will need to focus on controlling your diabetes during the weeks before surgery. Make sure that the doctor who takes care of your diabetes knows about your planned surgery including the date and location.  How do I manage my blood sugar before surgery? Check your blood sugar at least 4 times a day, starting 2 days before surgery, to make sure that the level is not too high or low.  Check your blood sugar the morning of your surgery when you wake up and every 2 hours until you get to the Short Stay unit.  If your blood sugar is less than 70 mg/dL, you will need to treat for low blood sugar: Do not take insulin . Treat a low blood sugar (less than 70 mg/dL) with  cup of clear juice (cranberry or apple), 4 glucose tablets, OR glucose gel. Recheck blood sugar in 15 minutes after treatment (to make sure it is greater than 70 mg/dL). If your blood sugar is not greater than 70 mg/dL on recheck, call [phone] for further instructions. Report your blood sugar to the short stay nurse when you get to Short Stay.  If you are admitted to the hospital after surgery: Your blood sugar will be checked by the staff and you will probably be given insulin  after surgery (instead of oral diabetes medicines) to  make sure you have good blood sugar levels. The goal for blood sugar control after surgery is 80-180 mg/dL.                      Do NOT Smoke (Tobacco/Vaping) for 24 hours prior to your procedure.  If you use a CPAP at night, you may bring your mask/headgear for your overnight stay.   You will be asked to remove any contacts, glasses, piercing's, hearing aid's, dentures/partials prior to surgery. Please bring cases for these items if needed.    Patients  discharged the day of surgery will not be allowed to drive home, and someone needs to stay with them for 24 hours.  SURGICAL WAITING ROOM VISITATION Patients may have no more than 2 support people in the waiting area - these visitors may rotate.   Pre-op nurse will coordinate an appropriate time for 1 ADULT support person, who may not rotate, to accompany patient in pre-op.  Children under the age of 81 must have an adult with them who is not the patient and must remain in the main waiting area with an adult.  If the patient needs to stay at the hospital during part of their recovery, the visitor guidelines for inpatient rooms apply.  Please refer to the [facility] website for the visitor guidelines for any additional information.   If you received a COVID test during your pre-op visit  it is requested that you wear a mask when out in public, stay away from anyone that may not be feeling well and notify your surgeon if you develop symptoms. If you have been in contact with anyone that has tested positive in the last 10 days please notify you surgeon.      Pre-operative 5 CHG Bathing Instructions   You can play a key role in reducing the risk of infection after surgery. Your skin needs to be as free of germs as possible. You can reduce the number of germs on your skin by washing with CHG (chlorhexidine  gluconate) soap before surgery. CHG is an antiseptic soap that kills germs and continues to kill germs even after washing.   DO NOT use if you have an allergy to chlorhexidine /CHG or antibacterial soaps. If your skin becomes reddened or irritated, stop using the CHG and notify one of our RNs at [phone].   Please shower with the CHG soap starting 4 days before surgery using the following schedule:     Please keep in mind the following:  DO NOT shave, including legs and underarms, starting the day of your first shower.   You may shave your face at any point before/day of surgery.   Place clean sheets on your bed the day you start using CHG soap. Use a clean washcloth (not used since being washed) for each shower. DO NOT sleep with pets once you start using the CHG.   CHG Shower Instructions:  Wash your face and private area with normal soap. If you choose to wash your hair, wash first with your normal shampoo.  After you use shampoo/soap, rinse your hair and body thoroughly to remove shampoo/soap residue.  Turn the water OFF and apply about 3 tablespoons (45 ml) of CHG soap to a CLEAN washcloth.  Apply CHG soap ONLY FROM YOUR NECK DOWN TO YOUR TOES (washing for 3-5 minutes)  DO NOT use CHG soap on face, private areas, open wounds, or sores.  Pay special attention to the area where your surgery is  being performed.  If you are having back surgery, having someone wash your back for you may be helpful. Wait 2 minutes after CHG soap is applied, then you may rinse off the CHG soap.  Pat dry with a clean towel  Put on clean clothes/pajamas   If you choose to wear lotion, please use ONLY the CHG-compatible lotions that are listed below.  Additional instructions for the day of surgery: DO NOT APPLY any lotions, deodorants, cologne, or perfumes.   Do not bring valuables to the hospital. [facility] is not responsible for any belongings/valuables. Do not wear nail polish, gel polish, artificial nails, or any other type of covering on natural nails (fingers and toes) Do not wear jewelry or makeup Put on clean/comfortable clothes.  Please brush your teeth.  Ask your nurse before applying any prescription medications to the skin.     CHG Compatible Lotions   Aveeno Moisturizing lotion  Cetaphil Moisturizing Cream  Cetaphil Moisturizing Lotion  Clairol Herbal Essence Moisturizing Lotion, Dry Skin  Clairol Herbal Essence Moisturizing Lotion, Extra Dry Skin  Clairol Herbal Essence Moisturizing Lotion, Normal Skin  Curel Age Defying Therapeutic Moisturizing Lotion with  Alpha Hydroxy  Curel Extreme Care Body Lotion  Curel Soothing Hands Moisturizing Hand Lotion  Curel Therapeutic Moisturizing Cream, Fragrance-Free  Curel Therapeutic Moisturizing Lotion, Fragrance-Free  Curel Therapeutic Moisturizing Lotion, Original Formula  Eucerin Daily Replenishing Lotion  Eucerin Dry Skin Therapy Plus Alpha Hydroxy Crme  Eucerin Dry Skin Therapy Plus Alpha Hydroxy Lotion  Eucerin Original Crme  Eucerin Original Lotion  Eucerin Plus Crme Eucerin Plus Lotion  Eucerin TriLipid Replenishing Lotion  Keri Anti-Bacterial Hand Lotion  Keri Deep Conditioning Original Lotion Dry Skin Formula Softly Scented  Keri Deep Conditioning Original Lotion, Fragrance Free Sensitive Skin Formula  Keri Lotion Fast Absorbing Fragrance Free Sensitive Skin Formula  Keri Lotion Fast Absorbing Softly Scented Dry Skin Formula  Keri Original Lotion  Keri Skin Renewal Lotion Keri Silky Smooth Lotion  Keri Silky Smooth Sensitive Skin Lotion  Nivea Body Creamy Conditioning Oil  Nivea Body Extra Enriched Lotion  Nivea Body Original Lotion  Nivea Body Sheer Moisturizing Lotion Nivea Crme  Nivea Skin Firming Lotion  NutraDerm 30 Skin Lotion  NutraDerm Skin Lotion  NutraDerm Therapeutic Skin Cream  NutraDerm Therapeutic Skin Lotion  ProShield Protective Hand Cream  Provon moisturizing lotion  Please read over the following fact sheets that you were given.

## 2024-06-25 ENCOUNTER — Encounter (HOSPITAL_COMMUNITY)
Admission: RE | Admit: 2024-06-25 | Discharge: 2024-06-25 | Disposition: A | Discharge status: Home / Self Care | Source: Ambulatory Visit | Attending: Neurosurgery | Admitting: Neurosurgery

## 2024-06-25 ENCOUNTER — Other Ambulatory Visit: Payer: Self-pay

## 2024-06-25 ENCOUNTER — Other Ambulatory Visit: Payer: Self-pay | Admitting: Neurosurgery

## 2024-06-25 ENCOUNTER — Encounter (HOSPITAL_COMMUNITY): Payer: Self-pay

## 2024-06-25 VITALS — BP 125/61 | HR 51 | Temp 98.1°F | Resp 17 | Ht 65.0 in | Wt 159.0 lb

## 2024-06-25 DIAGNOSIS — I4891 Unspecified atrial fibrillation: Secondary | ICD-10-CM | POA: Diagnosis not present

## 2024-06-25 DIAGNOSIS — R001 Bradycardia, unspecified: Secondary | ICD-10-CM | POA: Insufficient documentation

## 2024-06-25 DIAGNOSIS — Z955 Presence of coronary angioplasty implant and graft: Secondary | ICD-10-CM | POA: Diagnosis not present

## 2024-06-25 DIAGNOSIS — Z8679 Personal history of other diseases of the circulatory system: Secondary | ICD-10-CM

## 2024-06-25 DIAGNOSIS — Z905 Acquired absence of kidney: Secondary | ICD-10-CM | POA: Insufficient documentation

## 2024-06-25 DIAGNOSIS — I2510 Atherosclerotic heart disease of native coronary artery without angina pectoris: Secondary | ICD-10-CM | POA: Insufficient documentation

## 2024-06-25 DIAGNOSIS — K219 Gastro-esophageal reflux disease without esophagitis: Secondary | ICD-10-CM | POA: Diagnosis not present

## 2024-06-25 DIAGNOSIS — Z85528 Personal history of other malignant neoplasm of kidney: Secondary | ICD-10-CM | POA: Diagnosis not present

## 2024-06-25 DIAGNOSIS — E785 Hyperlipidemia, unspecified: Secondary | ICD-10-CM | POA: Insufficient documentation

## 2024-06-25 DIAGNOSIS — Z01818 Encounter for other preprocedural examination: Secondary | ICD-10-CM

## 2024-06-25 DIAGNOSIS — Z79899 Other long term (current) drug therapy: Secondary | ICD-10-CM | POA: Insufficient documentation

## 2024-06-25 DIAGNOSIS — E119 Type 2 diabetes mellitus without complications: Secondary | ICD-10-CM | POA: Diagnosis not present

## 2024-06-25 DIAGNOSIS — S32009K Unspecified fracture of unspecified lumbar vertebra, subsequent encounter for fracture with nonunion: Secondary | ICD-10-CM | POA: Diagnosis not present

## 2024-06-25 DIAGNOSIS — I10 Essential (primary) hypertension: Secondary | ICD-10-CM | POA: Diagnosis not present

## 2024-06-25 DIAGNOSIS — I252 Old myocardial infarction: Secondary | ICD-10-CM | POA: Insufficient documentation

## 2024-06-25 DIAGNOSIS — E118 Type 2 diabetes mellitus with unspecified complications: Secondary | ICD-10-CM

## 2024-06-25 DIAGNOSIS — Z7982 Long term (current) use of aspirin: Secondary | ICD-10-CM | POA: Insufficient documentation

## 2024-06-25 DIAGNOSIS — Z981 Arthrodesis status: Secondary | ICD-10-CM | POA: Diagnosis not present

## 2024-06-25 DIAGNOSIS — Z01812 Encounter for preprocedural laboratory examination: Secondary | ICD-10-CM | POA: Insufficient documentation

## 2024-06-25 LAB — BASIC METABOLIC PANEL WITH GFR
Anion gap: 10 (ref 5–15)
BUN: 11 mg/dL (ref 8–23)
CO2: 26 mmol/L (ref 22–32)
Calcium: 8.8 mg/dL — ABNORMAL LOW (ref 8.9–10.3)
Chloride: 101 mmol/L (ref 98–111)
Creatinine, Ser: 0.75 mg/dL (ref 0.61–1.24)
GFR, Estimated: >60 mL/min (ref >60)
Glucose, Bld: 107 mg/dL — ABNORMAL HIGH (ref 70–99)
Potassium: 4.5 mmol/L (ref 3.5–5.1)
Sodium: 137 mmol/L (ref 135–145)

## 2024-06-25 LAB — CBC
HCT: 39.5 % (ref 39.0–52.0)
Hemoglobin: 13 g/dL (ref 13.0–17.0)
MCH: 29.8 pg (ref 26.0–34.0)
MCHC: 32.9 g/dL (ref 30.0–36.0)
MCV: 90.6 fL (ref 80.0–100.0)
Platelets: 189 K/uL (ref 150–400)
RBC: 4.36 MIL/uL (ref 4.22–5.81)
RDW: 13.5 % (ref 11.5–15.5)
WBC: 5.8 K/uL (ref 4.0–10.5)
nRBC: 0 % (ref 0.0–0.2)

## 2024-06-25 LAB — SURGICAL PCR SCREEN
MRSA, PCR: NEGATIVE
Staphylococcus aureus: NEGATIVE

## 2024-06-25 LAB — HEMOGLOBIN A1C
Hgb A1c MFr Bld: 5.8 % — ABNORMAL HIGH (ref 4.8–5.6)
Mean Plasma Glucose: 119.76 mg/dL

## 2024-06-25 LAB — GLUCOSE, CAPILLARY: Glucose-Capillary: 118 mg/dL — ABNORMAL HIGH (ref 70–99)

## 2024-06-25 LAB — TYPE AND SCREEN
ABO/RH(D): O POS
Antibody Screen: NEGATIVE

## 2024-06-25 NOTE — Progress Notes (Signed)
 PCP - Dr. Darnelle Lefevre Cardiologist - Dr. Debby Manner LOV 03-11-24 w/follow up in 6 months  PPM/ICD - Denies Device Orders - n/a Rep Notified - n/a  Chest 12-09-23 CE EKG - 12-16-23 CE Stress Test - 12-17-21 CE ECHO - 08-09-21 CE Cardiac Cath - 08-19-19 CE  Sleep Study - Denies CPAP - n/a  Fasting Blood Sugar - 100-120's Checks Blood Sugar daily in the morning  Last dose of GLP1 agonist-  Denies GLP1 instructions: Denies   Blood Thinner Instructions: Denies Aspirin  instructions: Per patient per physician stop on Tuesday, Sept 23rd.  ERAS Protcol - Clears until 0800 PRE-SURGERY Ensure or G2- none  COVID TEST- n/a    Anesthesia review: Yes, DM, a-fib, HTN, CAD, MI    Patient denies shortness of breath, fever, cough and chest pain at PAT appointment. Patient denies any respiratory issues at this time.    All instructions explained to the patient, with a verbal understanding of the material. Patient agrees to go over the instructions while at home for a better understanding. Patient also instructed to self quarantine after being tested for COVID-19. The opportunity to ask questions was provided.

## 2024-06-28 NOTE — Progress Notes (Signed)
 Anesthesia Chart Review:  Case: [id number] Date/Time: 06/30/24 1048   Procedure: LAMINECTOMY WITH POSTERIOR LATERAL ARTHRODESIS LEVEL 1 (Back) - Removal of bilateral S1 screws and redo posterolateral arthrodesis - L5-S1   Anesthesia type: General   Diagnosis: Pseudoarthrosis of lumbar spine [S32.009K]   Pre-op diagnosis: Pseudoarthrosis of lumbar spine   [facility]: [facility] ROOM 20 / [facility]   Surgeons: Onetha Kuba, MD       DISCUSSION: Patient is a 69 year old male scheduled for the above procedure.  History includes never smoker, HTN, HLD, DM2, CAD (inferior MI, s/p RCA stent 2002; DES ostial ramus 08/19/19), afib (09/14/19; s/p afib ablation 09/14/20), GERD, right clear cell renal cancer (s/p right partial nephrectomy 08/27/11; right robotic XI partial nephrectomy 12/16/23), spinal surgery (L3-5 decompression 12/20/21; L2-S1 PLIF 08/16/22; L5-S1 ALIF 04/14/23; removal of L5-S1 hardware, redo posterolateral arthrodesis L5-S1 04/16/23).   Last cardiology follow-up with Dr. Raylene was on 03/11/24 for routine 63-month follow-up. No anginal symptoms. He was maintaining SR following afib ablation in 08/2020. Anticoagulation therapy was discontinued in 2023. He has mild, asymptomatic bradycardia. On lipid therapy per primary care. He was felt to be stable from a cardiac standpoint. Six month follow-up planned.     Nuclear stress test 11/2021 showed fixed inferior and inferior septal wall defect without reversible ischemia.  Echo 07/2021 showed EF 60 to 65%, no significant valvular abnormalities.  A1c 5.8%.  Creatinine 0.75. H/H 13.0/39.5, PLT 189K.   Anesthesia team to evaluate on the day of surgery.    VS: BP 125/61   Pulse (!) 51   Temp 36.7 C   Resp 17   Ht 5' 5 (1.651 m)   Wt 72.1 kg   SpO2 100%   BMI 26.46 kg/m    PROVIDERS: Otho Darnelle BRAVO, MD is PCP  Raylene Ned, MD is cardiologist Epifanio Lenis, MD is EP cardiologist Evern Maria, MD is urologist    LABS: Labs reviewed:  Acceptable for surgery. (all labs ordered are listed, but only abnormal results are displayed)  Labs Reviewed  GLUCOSE, CAPILLARY - Abnormal; Notable for the following components:      Result Value   Glucose-Capillary 118 (*)    All other components within normal limits  BASIC METABOLIC PANEL WITH GFR - Abnormal; Notable for the following components:   Glucose, Bld 107 (*)    Calcium  8.8 (*)    All other components within normal limits  HEMOGLOBIN A1C - Abnormal; Notable for the following components:   Hgb A1c MFr Bld 5.8 (*)    All other components within normal limits  SURGICAL PCR SCREEN  CBC  TYPE AND SCREEN    IMAGES: MRI Left Hip 04/21/24: IMPRESSION: - Mild degenerative change without accelerated arthrosis or acute abnormality. Possible small nondisplaced anterior superior labral tear. This is of uncertain clinical significance. -  Mild tendinosis the origin of the left hamstring.  MRI L-spine 04/21/24: IMPRESSION: 1. Prior L2-S1 posterior and interbody fusion. No significant residual foraminal or canal stenosis at these levels. 2. Mild degenerative changes at T12-L1 and L1-L2 without significant foraminal or canal stenosis.    EKG: EKG 12/16/2023 ([facility]): Per Narrative in Care Everywhere.  Sinus bradycardia  Right bundle branch block  T wave abnormality, consider inferolateral ischemia  When compared with ECG of 09-Dec-2023 10 10,  No significant change was found  Confirmed by Marcine Grice  70  on 12-17-2023 7 02 52 PM    CV:  Nuclear stress test 12/17/21 ([facility]): IMPRESSION:  1. Fixed inferior wall and inferior septal wall near the base to the mid  inferior wall.  The rest of the study is normal there is no evidence of  reversible ischemia.  2.  Calculated ejection fraction is 52%  3.  Totally fixed inferior and inferior septal wall defect without  reversible ischemia with an EF of 52%.     Echo 08/09/21 ([facility]): SUMMARY  There is mild  concentric left ventricular hypertrophy.  Global LV systolic function is preserved  Small area of basal inferior thinning and akinesis  LV ejection fraction = 60-65%.  There is aortic valve sclerosis.  There is no aortic stenosis.  Compared to the last study dated 05/2020 LV function improved  - Comparison TEE 09/13/20 LVEF 35-40% while in rapid afib, basal inferior and inferoseptal akinesis consistent with prior MI, hypokinesis in remaining segments    > 48 hour - 7 day Cardiac monitor 03/05/21 ([facility]): Results as outlined by EP Dr. Alm Needle in Care Everywhere: 2% burden of atrial arrhythmia and some short runs of atrial tachycardia but no atrial fibrillation.      EP Procedure 09/14/20 ([facility]): Electrophysiology study with transeptal Catherization using Intracardiac  Ultrasound, 3D mapping of Left Atrium and Pulmonary Veins pre and post  ablation, Pulmonary Vein Isolation using the Cryoballoon for Atrial  Fibrillation, Radiofrequency Ablation in the left atrium to complete  pulmonary vein isolation.      CT Cardiac 09/13/20 (CT evaluation of LA & pulmonary vein anatomy prior to endocardial ablation of atrial fibrillation; [facility]): Summary:  1. The left atrium is moderately dilated with an AP diameter 51.5 mm.  2. There are 2 left sided pulmonary veins and 2 right-sided pulmonary  veins.  a. Left inferior pulmonary vein 19.3 x 16.0 mm  b. Left superior pulmonary vein 21.7 x 14.3 mm  c. Right inferior pulmonary vein 22.0 x 13.2 mm  d. Right superior pulmonary vein 18.7 x 16.7 mm  3. There is no evidence of stenosis of the pulmonary vein connections.  4. The left atrial appendage is medium sized.  The orifice of the left  atrial appendage measures 20.7 x 12.7 mm.  There was no evidence of  thrombus in the left atrial appendage.  5. There is a small accessory left atrial appendage located in the right,  superior, anterior appendage.  It is free from thrombus..  6. The  heart is morphologically normal with no evidence of congenital  abnormalities.  7. Normal-appearing pericardium with no pericardial effusion       Cardiac cath 08/19/19 ([facility]): Angiographic Findings   Cardiac Arteries and Lesion Findings  LMCA: 0% and Normal.  LAD: Single stenosis.    Lesion on Prox LAD: Ostial.40% stenosis 5 mm length . Pre procedure TIMI    III flow was noted. Good run off was present. The lesion was diagnosed as    Moderate Risk (B). Bifurcation lesion.    Comments:normal iFR  LCx: Normal.  RCA: Abnormal and Single stenosis.    Lesion on Prox RCA: Mid subsection.70% stenosis. Pre procedure TIMI III    flow was noted. Good run off was present. The lesion was diagnosed as Low    Risk (A).    Comments:ISR    Normal iFR  Ramus: Abnormal and Single stenosis.    Lesion on Ramus: Ostial.85% stenosis 10 mm length reduced to 0%. Pre    procedure TIMI III flow was noted. Post Procedure TIMI III flow was  present. Good run off was present. The lesion was diagnosed as Moderate    Risk (B).    Diagnostic Summary  1. Multivessel CAD. LAD and RCA lesions with angiographic intermediate lesions, with normal iFR abnormal LV function, inferior basal akinesis  Diagnostic Recommendations  1. PCI of LAD and RCA deferred since normal Pd/Pa both sites.  Interventional Summary  1. Successful PCI / Resolute Onyx Drug Eluting Stent of the ostial Ramus Coronary Artery. DAPT x 6 months    Past Medical History:  Diagnosis Date   Arthritis    Cancer (HCC) 2012   Right clear cell renal cancer, s/p right partial nephrectomy 08/17/11   Coronary artery disease    inferior MI, s/p RCA stent 2002; DES ostimal ramus 08/19/19   Diabetes mellitus without complication (HCC)    Type 2   Dysrhythmia    afib, s/p ablation 09/14/20, no known recurrence (08/08/22)   GERD (gastroesophageal reflux disease)    Hyperlipemia    Hypertension    Low back pain    Myocardial infarction [facility])  2002   RCA stent [facility])   Wears glasses     Past Surgical History:  Procedure Laterality Date   ABDOMINAL EXPOSURE N/A 04/14/2023   Procedure: ABDOMINAL EXPOSURE;  Surgeon: Gretta Lonni PARAS, MD;  Location: [facility] OR;  Service: Vascular;  Laterality: N/A;   ANTERIOR LUMBAR FUSION N/A 04/14/2023   Procedure: ANTERIOR LUMBAR INTERBODY FUSION - LUMBAR FIVE-SACRAL ONE with removal of hardware;  Surgeon: Onetha Kuba, MD;  Location: [facility] OR;  Service: Neurosurgery;  Laterality: N/A;   CARDIAC CATHETERIZATION     RCA stent 2002; DES ostial ramus 08/19/19   CARPAL TUNNEL RELEASE Right 12/20/2014   Procedure: RIGHT CARPAL TUNNEL RELEASE;  Surgeon: Franky Curia, MD;  Location: [facility];  Service: Orthopedics;  Laterality: Right;   ELBOW ARTHROSCOPY  09/30/2008   left   FINGER GANGLION CYST EXCISION     rt thumb   GANGLION CYST EXCISION     lt wrist   GANGLION CYST EXCISION Right 12/20/2014   Procedure: REMOVAL GANGLION OF WRIST;  Surgeon: Franky Curia, MD;  Location: [facility];  Service: Orthopedics;  Laterality: Right;   LAMINECTOMY WITH POSTERIOR LATERAL ARTHRODESIS LEVEL 1 N/A 04/16/2023   Procedure: Lumbar Five-Sacral One Posterior revision of pedicle screws and posterolateral arthrodesis;  Surgeon: Onetha Kuba, MD;  Location: [facility] OR;  Service: Neurosurgery;  Laterality: N/A;   PARTIAL NEPHRECTOMY  08/27/2011   right   POSTERIOR LUMBAR FUSION 4 LEVEL N/A 08/16/2022   Procedure: LUMBAR TWO-THREE, LUMBAR THREE-FOUR, LUMBAR FOUR-FIVE, LUMBAR FIVE-SACRAL ONE POSTERIOR LUMBAR INTERBODY FUSION;  Surgeon: Joshua Alm RAMAN, MD;  Location: [facility] OR;  Service: Neurosurgery;  Laterality: N/A;   SHOULDER ARTHROSCOPY  10/01/2007   left   TONSILLECTOMY     ULNAR NERVE REPAIR  09/30/2010   left   ULNAR NERVE TRANSPOSITION Right 12/20/2014   Procedure: RIGHT ULNAR NERVE DECOMPRESSION, TRANSPOSITION;  Surgeon: Franky Curia, MD;  Location: [facility];   Service: Orthopedics;  Laterality: Right;    MEDICATIONS:  aspirin  EC 81 MG tablet   atenolol  (TENORMIN ) 50 MG tablet   atorvastatin  (LIPITOR) 80 MG tablet   B-D UF III MINI PEN NEEDLES 31G X 5 MM MISC   cyanocobalamin (VITAMIN B12) 1000 MCG tablet   ferrous sulfate 325 (65 FE) MG tablet   JOURNAVX 50 MG TABS   LANTUS SOLOSTAR 100 UNIT/ML Solostar Pen   lisinopril  (ZESTRIL ) 10 MG  tablet   magnesium  oxide (MAG-OX) 400 MG tablet   METAMUCIL FIBER PO   Multiple Vitamins-Minerals (PRESERVISION AREDS 2 PO)   nitroGLYCERIN  (NITROSTAT ) 0.4 MG SL tablet   omeprazole (PRILOSEC) 40 MG capsule   prednisoLONE acetate (PRED FORTE) 1 % ophthalmic suspension   ranolazine  (RANEXA ) 500 MG 12 hr tablet   sildenafil  (REVATIO ) 20 MG tablet   traZODone  (DESYREL ) 50 MG tablet   No current facility-administered medications for this encounter.   Last ASAS 06/22/2024.    Isaiah Ruder, PA-C Surgical Short Stay/Anesthesiology [facility] Phone [phone] [facility] Phone [phone] 06/28/2024 7:49 PM

## 2024-06-28 NOTE — Anesthesia Preprocedure Evaluation (Signed)
 Anesthesia Evaluation  Patient identified by MRN, date of birth, ID band Patient awake    Reviewed: Allergy & Precautions, NPO status , Patient's Chart, lab work & pertinent test results  Airway Mallampati: I  TM Distance: >3 FB Neck ROM: Full    Dental no notable dental hx.    Pulmonary neg pulmonary ROS   Pulmonary exam normal        Cardiovascular hypertension, Pt. on home beta blockers and Pt. on medications + CAD, + Past MI and + Cardiac Stents  Normal cardiovascular exam     Neuro/Psych  Neuromuscular disease    GI/Hepatic Neg liver ROS,GERD  Medicated and Controlled,,  Endo/Other  diabetes    Renal/GU      Musculoskeletal  (+) Arthritis ,  Low back pain   Abdominal   Peds  Hematology negative hematology ROS (+)   Anesthesia Other Findings Pseudoarthrosis of lumbar spine  Reproductive/Obstetrics                              Anesthesia Physical Anesthesia Plan  ASA: 3  Anesthesia Plan: General   Post-op Pain Management:    Induction: Intravenous  PONV Risk Score and Plan: 2 and Ondansetron , Dexamethasone , Treatment may vary due to age or medical condition and Midazolam   Airway Management Planned: Oral ETT  Additional Equipment:   Intra-op Plan:   Post-operative Plan: Extubation in OR  Informed Consent: I have reviewed the patients History and Physical, chart, labs and discussed the procedure including the risks, benefits and alternatives for the proposed anesthesia with the patient or authorized representative who has indicated his/her understanding and acceptance.     Dental advisory given  Plan Discussed with: CRNA  Anesthesia Plan Comments: (PAT note written 06/28/2024 by Shavon Zenz, PA-C.  )         Anesthesia Quick Evaluation

## 2024-06-30 ENCOUNTER — Inpatient Hospital Stay (HOSPITAL_COMMUNITY)
Admission: RE | Admit: 2024-06-30 | Discharge: 2024-06-30 | DRG: 451 | Disposition: A | Discharge status: Home / Self Care | Attending: Neurosurgery | Admitting: Neurosurgery

## 2024-06-30 ENCOUNTER — Inpatient Hospital Stay (HOSPITAL_COMMUNITY): Admitting: Vascular Surgery

## 2024-06-30 ENCOUNTER — Inpatient Hospital Stay (HOSPITAL_COMMUNITY): Admitting: Anesthesiology

## 2024-06-30 ENCOUNTER — Inpatient Hospital Stay (HOSPITAL_COMMUNITY)

## 2024-06-30 ENCOUNTER — Encounter (HOSPITAL_COMMUNITY)
Admission: RE | Disposition: A | Discharge status: Home / Self Care | Payer: Self-pay | Source: Home / Self Care | Attending: Neurosurgery

## 2024-06-30 ENCOUNTER — Encounter (HOSPITAL_COMMUNITY): Payer: Self-pay | Admitting: Neurosurgery

## 2024-06-30 ENCOUNTER — Other Ambulatory Visit: Payer: Self-pay

## 2024-06-30 DIAGNOSIS — S32009K Unspecified fracture of unspecified lumbar vertebra, subsequent encounter for fracture with nonunion: Secondary | ICD-10-CM | POA: Diagnosis not present

## 2024-06-30 DIAGNOSIS — Z905 Acquired absence of kidney: Secondary | ICD-10-CM | POA: Diagnosis not present

## 2024-06-30 DIAGNOSIS — K219 Gastro-esophageal reflux disease without esophagitis: Secondary | ICD-10-CM | POA: Diagnosis present

## 2024-06-30 DIAGNOSIS — Z888 Allergy status to other drugs, medicaments and biological substances status: Secondary | ICD-10-CM | POA: Diagnosis not present

## 2024-06-30 DIAGNOSIS — M960 Pseudarthrosis after fusion or arthrodesis: Principal | ICD-10-CM | POA: Diagnosis present

## 2024-06-30 DIAGNOSIS — E119 Type 2 diabetes mellitus without complications: Secondary | ICD-10-CM | POA: Diagnosis present

## 2024-06-30 DIAGNOSIS — Z981 Arthrodesis status: Secondary | ICD-10-CM | POA: Diagnosis not present

## 2024-06-30 DIAGNOSIS — M159 Polyosteoarthritis, unspecified: Secondary | ICD-10-CM | POA: Diagnosis present

## 2024-06-30 DIAGNOSIS — Z8249 Family history of ischemic heart disease and other diseases of the circulatory system: Secondary | ICD-10-CM | POA: Diagnosis not present

## 2024-06-30 DIAGNOSIS — I10 Essential (primary) hypertension: Secondary | ICD-10-CM | POA: Diagnosis present

## 2024-06-30 DIAGNOSIS — E7800 Pure hypercholesterolemia, unspecified: Secondary | ICD-10-CM

## 2024-06-30 DIAGNOSIS — Z882 Allergy status to sulfonamides status: Secondary | ICD-10-CM

## 2024-06-30 DIAGNOSIS — Z79899 Other long term (current) drug therapy: Secondary | ICD-10-CM | POA: Diagnosis not present

## 2024-06-30 DIAGNOSIS — Z881 Allergy status to other antibiotic agents status: Secondary | ICD-10-CM | POA: Diagnosis not present

## 2024-06-30 DIAGNOSIS — Z85528 Personal history of other malignant neoplasm of kidney: Secondary | ICD-10-CM | POA: Diagnosis not present

## 2024-06-30 DIAGNOSIS — Z886 Allergy status to analgesic agent status: Secondary | ICD-10-CM

## 2024-06-30 DIAGNOSIS — T8484XA Pain due to internal orthopedic prosthetic devices, implants and grafts, initial encounter: Secondary | ICD-10-CM | POA: Diagnosis present

## 2024-06-30 DIAGNOSIS — Z794 Long term (current) use of insulin: Secondary | ICD-10-CM

## 2024-06-30 DIAGNOSIS — I252 Old myocardial infarction: Secondary | ICD-10-CM

## 2024-06-30 DIAGNOSIS — Y831 Surgical operation with implant of artificial internal device as the cause of abnormal reaction of the patient, or of later complication, without mention of misadventure at the time of the procedure: Secondary | ICD-10-CM | POA: Diagnosis present

## 2024-06-30 DIAGNOSIS — Z880 Allergy status to penicillin: Secondary | ICD-10-CM | POA: Diagnosis not present

## 2024-06-30 DIAGNOSIS — I2510 Atherosclerotic heart disease of native coronary artery without angina pectoris: Secondary | ICD-10-CM

## 2024-06-30 DIAGNOSIS — Z91041 Radiographic dye allergy status: Secondary | ICD-10-CM

## 2024-06-30 DIAGNOSIS — E118 Type 2 diabetes mellitus with unspecified complications: Secondary | ICD-10-CM

## 2024-06-30 DIAGNOSIS — Z01818 Encounter for other preprocedural examination: Secondary | ICD-10-CM

## 2024-06-30 DIAGNOSIS — I4891 Unspecified atrial fibrillation: Secondary | ICD-10-CM | POA: Diagnosis present

## 2024-06-30 DIAGNOSIS — E785 Hyperlipidemia, unspecified: Secondary | ICD-10-CM | POA: Diagnosis present

## 2024-06-30 HISTORY — PX: LAMINECTOMY WITH POSTERIOR LATERAL ARTHRODESIS LEVEL 1: SHX6335

## 2024-06-30 LAB — GLUCOSE, CAPILLARY
Glucose-Capillary: 102 mg/dL — ABNORMAL HIGH (ref 70–99)
Glucose-Capillary: 80 mg/dL (ref 70–99)
Glucose-Capillary: 98 mg/dL (ref 70–99)
Glucose-Capillary: 273 mg/dL — ABNORMAL HIGH (ref 70–99)

## 2024-06-30 SURGERY — LAMINECTOMY WITH POSTERIOR LATERAL ARTHRODESIS LEVEL 1
Anesthesia: General | Site: Back

## 2024-06-30 MED ORDER — FENTANYL CITRATE (PF) 100 MCG/2ML IJ SOLN: 25.0000 ug | INTRAMUSCULAR | Status: DC | PRN

## 2024-06-30 MED ORDER — MAGNESIUM OXIDE 400 MG PO TABS
400.0000 mg | ORAL_TABLET | Freq: Every day | ORAL | Status: DC
  Filled 2024-06-30: qty 1

## 2024-06-30 MED ORDER — DEXAMETHASONE SODIUM PHOSPHATE 10 MG/ML IJ SOLN
INTRAMUSCULAR | Status: AC
Start: 2024-06-30 — End: 2024-06-30
  Filled 2024-06-30: qty 1

## 2024-06-30 MED ORDER — ATENOLOL 50 MG PO TABS: 50.0000 mg | ORAL_TABLET | Freq: Every evening | ORAL | Status: DC

## 2024-06-30 MED ORDER — SODIUM CHLORIDE 0.9% FLUSH: 3.0000 mL | Freq: Two times a day (BID) | INTRAVENOUS | Status: DC

## 2024-06-30 MED ORDER — ACETAMINOPHEN 10 MG/ML IV SOLN: 1000.0000 mg | Freq: Once | INTRAVENOUS | Status: DC | PRN

## 2024-06-30 MED ORDER — ACETAMINOPHEN 325 MG PO TABS: 650.0000 mg | ORAL_TABLET | ORAL | Status: DC | PRN

## 2024-06-30 MED ORDER — CHLORHEXIDINE GLUCONATE CLOTH 2 % EX PADS: 6.0000 | MEDICATED_PAD | Freq: Once | CUTANEOUS | Status: DC

## 2024-06-30 MED ORDER — CEFAZOLIN SODIUM-DEXTROSE 2-4 GM/100ML-% IV SOLN
2.0000 g | INTRAVENOUS | Status: DC
Start: 2024-06-30 — End: 2024-06-30

## 2024-06-30 MED ORDER — ACETAMINOPHEN 650 MG RE SUPP: 650.0000 mg | RECTAL | Status: DC | PRN

## 2024-06-30 MED ORDER — PHENYLEPHRINE HCL-NACL 20-0.9 MG/250ML-% IV SOLN
INTRAVENOUS | Status: DC | PRN
  Administered 2024-06-30: 20 ug/min via INTRAVENOUS

## 2024-06-30 MED ORDER — LACTATED RINGERS IV SOLN: INTRAVENOUS | Status: DC

## 2024-06-30 MED ORDER — VITAMIN B-12 1000 MCG PO TABS: 1000.0000 ug | ORAL_TABLET | Freq: Every evening | ORAL | Status: DC

## 2024-06-30 MED ORDER — PHENOL 1.4 % MT LIQD: 1.0000 | OROMUCOSAL | Status: DC | PRN

## 2024-06-30 MED ORDER — NITROGLYCERIN 0.4 MG SL SUBL: 0.4000 mg | SUBLINGUAL_TABLET | SUBLINGUAL | Status: DC | PRN

## 2024-06-30 MED ORDER — PROPOFOL 10 MG/ML IV BOLUS
INTRAVENOUS | Status: AC
  Filled 2024-06-30: qty 20

## 2024-06-30 MED ORDER — ONDANSETRON HCL 4 MG/2ML IJ SOLN
INTRAMUSCULAR | Status: AC
  Filled 2024-06-30: qty 2

## 2024-06-30 MED ORDER — EPHEDRINE SULFATE-NACL 50-0.9 MG/10ML-% IV SOSY
PREFILLED_SYRINGE | INTRAVENOUS | Status: DC | PRN
Start: 2024-06-30 — End: 2024-06-30
  Administered 2024-06-30 (×3): 5 mg via INTRAVENOUS
  Administered 2024-06-30: 10 mg via INTRAVENOUS

## 2024-06-30 MED ORDER — RANOLAZINE ER 500 MG PO TB12: 500.0000 mg | ORAL_TABLET | Freq: Two times a day (BID) | ORAL | Status: DC

## 2024-06-30 MED ORDER — LISINOPRIL 10 MG PO TABS: 10.0000 mg | ORAL_TABLET | Freq: Every morning | ORAL | Status: DC

## 2024-06-30 MED ORDER — CHLORHEXIDINE GLUCONATE 0.12 % MT SOLN: 15.0000 mL | Freq: Once | OROMUCOSAL | Status: AC

## 2024-06-30 MED ORDER — LIDOCAINE-EPINEPHRINE 1 %-1:100000 IJ SOLN
INTRAMUSCULAR | Status: DC | PRN
  Administered 2024-06-30: 10 mL

## 2024-06-30 MED ORDER — BUPIVACAINE HCL (PF) 0.25 % IJ SOLN: INTRAMUSCULAR | Status: DC | PRN

## 2024-06-30 MED ORDER — THROMBIN 20000 UNITS EX SOLR
CUTANEOUS | Status: DC | PRN
  Administered 2024-06-30: 20 mL via TOPICAL

## 2024-06-30 MED ORDER — HYDROMORPHONE HCL 1 MG/ML IJ SOLN: 0.5000 mg | INTRAMUSCULAR | Status: DC | PRN

## 2024-06-30 MED ORDER — SILDENAFIL CITRATE 20 MG PO TABS: 40.0000 mg | ORAL_TABLET | ORAL | Status: DC | PRN

## 2024-06-30 MED ORDER — THROMBIN 20000 UNITS EX SOLR
CUTANEOUS | Status: AC
  Filled 2024-06-30: qty 20000

## 2024-06-30 MED ORDER — ONDANSETRON HCL 4 MG/2ML IJ SOLN: 4.0000 mg | Freq: Four times a day (QID) | INTRAMUSCULAR | Status: DC | PRN

## 2024-06-30 MED ORDER — PROPOFOL 10 MG/ML IV BOLUS
INTRAVENOUS | Status: DC | PRN
  Administered 2024-06-30: 150 mg via INTRAVENOUS

## 2024-06-30 MED ORDER — HYDROCODONE-ACETAMINOPHEN 10-325 MG PO TABS
1.0000 | ORAL_TABLET | Freq: Four times a day (QID) | ORAL | Status: DC | PRN
  Administered 2024-06-30: 1 via ORAL
  Filled 2024-06-30: qty 1

## 2024-06-30 MED ORDER — ORAL CARE MOUTH RINSE: 15.0000 mL | Freq: Once | OROMUCOSAL | Status: AC

## 2024-06-30 MED ORDER — CLINDAMYCIN PHOSPHATE 900 MG/50ML IV SOLN
900.0000 mg | Freq: Once | INTRAVENOUS | Status: AC
  Administered 2024-06-30: 900 mg via INTRAVENOUS

## 2024-06-30 MED ORDER — PANTOPRAZOLE SODIUM 40 MG IV SOLR: 40.0000 mg | Freq: Every day | INTRAVENOUS | Status: DC

## 2024-06-30 MED ORDER — INSULIN ASPART 100 UNIT/ML IJ SOLN: 0.0000 [IU] | INTRAMUSCULAR | Status: DC | PRN

## 2024-06-30 MED ORDER — SUGAMMADEX SODIUM 200 MG/2ML IV SOLN
INTRAVENOUS | Status: DC | PRN
  Administered 2024-06-30: 200 mg via INTRAVENOUS

## 2024-06-30 MED ORDER — ONDANSETRON HCL 4 MG/2ML IJ SOLN
INTRAMUSCULAR | Status: DC | PRN
  Administered 2024-06-30: 4 mg via INTRAVENOUS

## 2024-06-30 MED ORDER — SUZETRIGINE 50 MG PO TABS
50.0000 mg | ORAL_TABLET | Freq: Two times a day (BID) | ORAL | Status: DC
Start: 2024-06-30 — End: 2024-07-01

## 2024-06-30 MED ORDER — CYCLOBENZAPRINE HCL 10 MG PO TABS: 10.0000 mg | ORAL_TABLET | Freq: Three times a day (TID) | ORAL | Status: DC | PRN

## 2024-06-30 MED ORDER — ALUM & MAG HYDROXIDE-SIMETH 200-200-20 MG/5ML PO SUSP: 30.0000 mL | Freq: Four times a day (QID) | ORAL | Status: DC | PRN

## 2024-06-30 MED ORDER — ONDANSETRON HCL 4 MG PO TABS: 4.0000 mg | ORAL_TABLET | Freq: Four times a day (QID) | ORAL | Status: DC | PRN

## 2024-06-30 MED ORDER — 0.9 % SODIUM CHLORIDE (POUR BTL) OPTIME
TOPICAL | Status: DC | PRN
  Administered 2024-06-30: 1000 mL

## 2024-06-30 MED ORDER — AMISULPRIDE (ANTIEMETIC) 5 MG/2ML IV SOLN: 10.0000 mg | Freq: Once | INTRAVENOUS | Status: DC | PRN

## 2024-06-30 MED ORDER — SODIUM CHLORIDE 0.9 % IV SOLN
250.0000 mL | INTRAVENOUS | Status: DC
  Administered 2024-06-30: 250 mL via INTRAVENOUS

## 2024-06-30 MED ORDER — SODIUM CHLORIDE 0.9% FLUSH: 3.0000 mL | INTRAVENOUS | Status: DC | PRN

## 2024-06-30 MED ORDER — INSULIN ASPART 100 UNIT/ML IJ SOLN: 0.0000 [IU] | Freq: Three times a day (TID) | INTRAMUSCULAR | Status: DC

## 2024-06-30 MED ORDER — ONDANSETRON HCL 4 MG/2ML IJ SOLN: 4.0000 mg | Freq: Once | INTRAMUSCULAR | Status: DC | PRN

## 2024-06-30 MED ORDER — ASPIRIN 81 MG PO TBEC: 81.0000 mg | DELAYED_RELEASE_TABLET | Freq: Every morning | ORAL | Status: DC

## 2024-06-30 MED ORDER — CHLORHEXIDINE GLUCONATE 0.12 % MT SOLN
OROMUCOSAL | Status: AC
  Administered 2024-06-30: 15 mL via OROMUCOSAL
  Filled 2024-06-30: qty 15

## 2024-06-30 MED ORDER — BUPIVACAINE LIPOSOME 1.3 % IJ SUSP
INTRAMUSCULAR | Status: AC
Start: 2024-06-30 — End: 2024-06-30
  Filled 2024-06-30: qty 20

## 2024-06-30 MED ORDER — TRAZODONE HCL 100 MG PO TABS
100.0000 mg | ORAL_TABLET | Freq: Every day | ORAL | Status: DC
  Filled 2024-06-30: qty 1

## 2024-06-30 MED ORDER — LIDOCAINE HCL (CARDIAC) PF 100 MG/5ML IV SOSY
PREFILLED_SYRINGE | INTRAVENOUS | Status: DC | PRN
  Administered 2024-06-30: 60 mg via INTRAVENOUS

## 2024-06-30 MED ORDER — CLINDAMYCIN PHOSPHATE 900 MG/50ML IV SOLN
INTRAVENOUS | Status: AC
  Filled 2024-06-30: qty 50

## 2024-06-30 MED ORDER — ATORVASTATIN CALCIUM 80 MG PO TABS: 80.0000 mg | ORAL_TABLET | Freq: Every morning | ORAL | Status: DC

## 2024-06-30 MED ORDER — PANTOPRAZOLE SODIUM 40 MG PO TBEC: 40.0000 mg | DELAYED_RELEASE_TABLET | Freq: Every day | ORAL | Status: DC

## 2024-06-30 MED ORDER — CEFAZOLIN SODIUM-DEXTROSE 2-4 GM/100ML-% IV SOLN
INTRAVENOUS | Status: AC
  Filled 2024-06-30: qty 100

## 2024-06-30 MED ORDER — FERROUS SULFATE 325 (65 FE) MG PO TABS: 325.0000 mg | ORAL_TABLET | Freq: Every evening | ORAL | Status: DC

## 2024-06-30 MED ORDER — LIDOCAINE-EPINEPHRINE 1 %-1:100000 IJ SOLN
INTRAMUSCULAR | Status: AC
Start: 2024-06-30 — End: 2024-06-30
  Filled 2024-06-30: qty 1

## 2024-06-30 MED ORDER — DEXAMETHASONE SODIUM PHOSPHATE 10 MG/ML IJ SOLN
INTRAMUSCULAR | Status: DC | PRN
  Administered 2024-06-30: 5 mg via INTRAVENOUS

## 2024-06-30 MED ORDER — CYCLOBENZAPRINE HCL 10 MG PO TABS: 10.0000 mg | ORAL_TABLET | Freq: Three times a day (TID) | ORAL | 0 refills | Status: AC | PRN

## 2024-06-30 MED ORDER — CHLORHEXIDINE GLUCONATE CLOTH 2 % EX PADS
6.0000 | MEDICATED_PAD | Freq: Once | CUTANEOUS | Status: DC
Start: 2024-06-30 — End: 2024-06-30

## 2024-06-30 MED ORDER — BUPIVACAINE HCL (PF) 0.25 % IJ SOLN
INTRAMUSCULAR | Status: AC
Start: 2024-06-30 — End: 2024-06-30
  Filled 2024-06-30: qty 30

## 2024-06-30 MED ORDER — MIDAZOLAM HCL 2 MG/2ML IJ SOLN
INTRAMUSCULAR | Status: AC
Start: 2024-06-30 — End: 2024-06-30
  Filled 2024-06-30: qty 2

## 2024-06-30 MED ORDER — FENTANYL CITRATE (PF) 250 MCG/5ML IJ SOLN
INTRAMUSCULAR | Status: DC | PRN
  Administered 2024-06-30 (×2): 50 ug via INTRAVENOUS
  Administered 2024-06-30: 150 ug via INTRAVENOUS

## 2024-06-30 MED ORDER — ROCURONIUM BROMIDE 100 MG/10ML IV SOLN
INTRAVENOUS | Status: DC | PRN
  Administered 2024-06-30: 60 mg via INTRAVENOUS

## 2024-06-30 MED ORDER — HYDROCODONE-ACETAMINOPHEN 10-325 MG PO TABS: 1.0000 | ORAL_TABLET | Freq: Four times a day (QID) | ORAL | 0 refills | Status: AC | PRN

## 2024-06-30 MED ORDER — MIDAZOLAM HCL 2 MG/2ML IJ SOLN
INTRAMUSCULAR | Status: DC | PRN
  Administered 2024-06-30: 1 mg via INTRAVENOUS

## 2024-06-30 MED ORDER — CLINDAMYCIN PHOSPHATE 600 MG/50ML IV SOLN
600.0000 mg | Freq: Once | INTRAVENOUS | Status: AC
  Administered 2024-06-30: 600 mg via INTRAVENOUS
  Filled 2024-06-30: qty 50

## 2024-06-30 MED ORDER — GLYCOPYRROLATE PF 0.2 MG/ML IJ SOSY
PREFILLED_SYRINGE | INTRAMUSCULAR | Status: AC
Start: 2024-06-30 — End: 2024-06-30
  Filled 2024-06-30: qty 2

## 2024-06-30 MED ORDER — MENTHOL 3 MG MT LOZG: 1.0000 | LOZENGE | OROMUCOSAL | Status: DC | PRN

## 2024-06-30 MED ORDER — GLYCOPYRROLATE PF 0.2 MG/ML IJ SOSY
PREFILLED_SYRINGE | INTRAMUSCULAR | Status: DC | PRN
Start: 2024-06-30 — End: 2024-06-30
  Administered 2024-06-30 (×2): .1 mg via INTRAVENOUS

## 2024-06-30 MED ORDER — FENTANYL CITRATE (PF) 250 MCG/5ML IJ SOLN
INTRAMUSCULAR | Status: AC
  Filled 2024-06-30: qty 5

## 2024-06-30 SURGICAL SUPPLY — 53 items
BAG COUNTER SPONGE SURGICOUNT (BAG) ×1 IMPLANT
BENZOIN TINCTURE PRP APPL 2/3 (GAUZE/BANDAGES/DRESSINGS) ×1 IMPLANT
BLADE CLIPPER SURG (BLADE) IMPLANT
BLADE SURG 11 STRL SS (BLADE) ×1 IMPLANT
BUR MATCHSTICK NEURO 3.0 LAGG (BURR) ×1 IMPLANT
BUR PRECISION FLUTE 6.0 (BURR) ×1 IMPLANT
CANISTER SUCTION 3000ML PPV (SUCTIONS) ×1 IMPLANT
CLSR STERI-STRIP ANTIMIC 1/2X4 (GAUZE/BANDAGES/DRESSINGS) IMPLANT
CNTNR URN SCR LID CUP LEK RST (MISCELLANEOUS) ×1 IMPLANT
COVER BACK TABLE 24X17X13 BIG (DRAPES) IMPLANT
COVER BACK TABLE 60X90IN (DRAPES) ×1 IMPLANT
DERMABOND ADVANCED .7 DNX12 (GAUZE/BANDAGES/DRESSINGS) ×1 IMPLANT
DRAPE C-ARM 42X72 X-RAY (DRAPES) ×2 IMPLANT
DRAPE HALF SHEET 40X57 (DRAPES) IMPLANT
DRAPE LAPAROTOMY 100X72X124 (DRAPES) ×1 IMPLANT
DRAPE POUCH INSTRU U-SHP 10X18 (DRAPES) ×1 IMPLANT
DRAPE SURG 17X23 STRL (DRAPES) ×1 IMPLANT
DRSG OPSITE POSTOP 4X6 (GAUZE/BANDAGES/DRESSINGS) IMPLANT
DURAPREP 26ML APPLICATOR (WOUND CARE) ×1 IMPLANT
ELECTRODE REM PT RTRN 9FT ADLT (ELECTROSURGICAL) ×1 IMPLANT
EVACUATOR 1/8 PVC DRAIN (DRAIN) ×1 IMPLANT
GAUZE 4X4 16PLY ~~LOC~~+RFID DBL (SPONGE) IMPLANT
GAUZE SPONGE 4X4 12PLY STRL (GAUZE/BANDAGES/DRESSINGS) ×1 IMPLANT
GLOVE BIO SURGEON STRL SZ7 (GLOVE) IMPLANT
GLOVE BIO SURGEON STRL SZ8 (GLOVE) ×2 IMPLANT
GLOVE BIOGEL PI IND STRL 7.0 (GLOVE) IMPLANT
GLOVE EXAM NITRILE XL STR (GLOVE) IMPLANT
GLOVE INDICATOR 8.5 STRL (GLOVE) ×4 IMPLANT
GOWN STRL REUS W/ TWL LRG LVL3 (GOWN DISPOSABLE) IMPLANT
GOWN STRL REUS W/ TWL XL LVL3 (GOWN DISPOSABLE) ×2 IMPLANT
GOWN STRL REUS W/TWL 2XL LVL3 (GOWN DISPOSABLE) IMPLANT
GRAFT BNE MATRIX VG FRMBL L 10 (Bone Implant) IMPLANT
KIT BASIN OR (CUSTOM PROCEDURE TRAY) ×1 IMPLANT
KIT INFUSE X SMALL 1.4CC (Orthopedic Implant) IMPLANT
KIT TURNOVER KIT B (KITS) ×1 IMPLANT
NDL HYPO 25X1 1.5 SAFETY (NEEDLE) ×1 IMPLANT
NEEDLE HYPO 25X1 1.5 SAFETY (NEEDLE) ×1 IMPLANT
PACK LAMINECTOMY NEURO (CUSTOM PROCEDURE TRAY) ×1 IMPLANT
PAD ARMBOARD POSITIONER FOAM (MISCELLANEOUS) ×3 IMPLANT
SOLN 0.9% NACL 1000 ML (IV SOLUTION) ×1 IMPLANT
SOLN 0.9% NACL POUR BTL 1000ML (IV SOLUTION) ×1 IMPLANT
SOLN STERILE WATER 1000 ML (IV SOLUTION) ×1 IMPLANT
SOLN STERILE WATER BTL 1000 ML (IV SOLUTION) ×1 IMPLANT
SPIKE FLUID TRANSFER (MISCELLANEOUS) ×1 IMPLANT
SPONGE SURGIFOAM ABS GEL 100 (HEMOSTASIS) ×1 IMPLANT
SPONGE T-LAP 4X18 ~~LOC~~+RFID (SPONGE) IMPLANT
STRIP CLOSURE SKIN 1/2X4 (GAUZE/BANDAGES/DRESSINGS) ×2 IMPLANT
SUT VIC AB 0 CT1 18XCR BRD8 (SUTURE) ×2 IMPLANT
SUT VIC AB 2-0 CT1 18 (SUTURE) ×1 IMPLANT
SUT VIC AB 4-0 PS2 18 (SUTURE) ×1 IMPLANT
TOWEL GREEN STERILE (TOWEL DISPOSABLE) ×1 IMPLANT
TOWEL GREEN STERILE FF (TOWEL DISPOSABLE) ×1 IMPLANT
TRAY FOLEY MTR SLVR 16FR STAT (SET/KITS/TRAYS/PACK) ×1 IMPLANT

## 2024-06-30 NOTE — Plan of Care (Signed)

## 2024-06-30 NOTE — Progress Notes (Signed)
 Patient alert and oriented, mae's well, voiding adequate amount of urine, swallowing without difficulty, no c/o pain at time of discharge. Patient discharged home with family. Script and discharged instructions given to patient. Patient and family stated understanding of instructions given. Patient has an appointment with Dr. Onetha In 2 weeks. Patient waiting for family for ride home

## 2024-06-30 NOTE — Transfer of Care (Signed)
 Immediate Anesthesia Transfer of Care Note  Patient: Adam Roach  Procedure(s) Performed: LAMINECTOMY WITH POSTERIOR LATERAL ARTHRODESIS LUMBAR FIVE-SACRAL ONE (Back)  Patient Location: PACU  Anesthesia Type:General  Level of Consciousness: drowsy  Airway & Oxygen Therapy: Patient Spontanous Breathing and Patient connected to nasal cannula oxygen  Post-op Assessment: Report given to RN and Post -op Vital signs reviewed and stable  Post vital signs: Reviewed and stable  Last Vitals:  Vitals Value Taken Time  BP 157/65 06/30/24 13:34  Temp    Pulse 72 06/30/24 13:35  Resp 11 06/30/24 13:35  SpO2 95 % 06/30/24 13:35  Vitals shown include unfiled device data.  Last Pain:  Vitals:   06/30/24 0925  TempSrc:   PainSc: 10-Worst pain ever      Patients Stated Pain Goal: 4 (06/30/24 0925)  Complications: No notable events documented.

## 2024-06-30 NOTE — Evaluation (Signed)
 Occupational Therapy Evaluation & Discharge Patient Details Name: Adam Roach MRN: [id number] DOB: 1955/12/18 Today's Date: 06/30/2024   History of Present Illness   Pt admitted 10/1 for scheduled hardware removal of L5 & S1.     Clinical Impressions Pt admitted based on above, and was seen based on problem list below. PTA pt was independent with ADLs and IADLs. Today pt is supervision to mod I with ADLs with use of compensatory strategies for ADLs. Pt tolerating functional mobility ~337ft no AD well. Provided and reviewed back educational handout with pt; pt able to verbalize and demo return understanding. No follow up OT or DME needs identified. All education complete, no further acute OT needs, OT is signing off on this pt.      If plan is discharge home, recommend the following:   Assistance with cooking/housework     Functional Status Assessment   Patient has had a recent decline in their functional status and demonstrates the ability to make significant improvements in function in a reasonable and predictable amount of time.     Equipment Recommendations   None recommended by OT      Precautions/Restrictions   Precautions Precautions: Back Precaution Booklet Issued: Yes (comment) Recall of Precautions/Restrictions: Intact Precaution/Restrictions Comments: No Brace Restrictions Weight Bearing Restrictions Per Provider Order: No     Mobility Bed Mobility Overal bed mobility: Modified Independent   General bed mobility comments: HOB elevated    Transfers Overall transfer level: Modified independent Equipment used: None     General transfer comment: No AD, ~329ft no LOB or sway      Balance Overall balance assessment: Mild deficits observed, not formally tested         ADL either performed or assessed with clinical judgement   ADL Overall ADL's : Modified independent     General ADL Comments: With use of compensatory strategies pt able to  complete LB dressing, verbally recall strategy for oral hygiene and functional transfers at mod I     Vision Patient Visual Report: No change from baseline Vision Assessment?: No apparent visual deficits            Pertinent Vitals/Pain Pain Assessment Pain Assessment: Faces Faces Pain Scale: Hurts a little bit Pain Location: back Pain Descriptors / Indicators: Discomfort Pain Intervention(s): Monitored during session     Extremity/Trunk Assessment Upper Extremity Assessment Upper Extremity Assessment: Overall WFL for tasks assessed   Lower Extremity Assessment Lower Extremity Assessment: Overall WFL for tasks assessed   Cervical / Trunk Assessment Cervical / Trunk Assessment: Back Surgery   Communication Communication Communication: No apparent difficulties   Cognition Arousal: Alert Behavior During Therapy: WFL for tasks assessed/performed Cognition: No apparent impairments       Following commands: Intact       Cueing  General Comments   Cueing Techniques: Verbal cues  Family present for education provided           Home Living Family/patient expects to be discharged to:: Private residence Living Arrangements: Alone Available Help at Discharge: Family;Friend(s) Type of Home: House Home Access: Stairs to enter Secretary/administrator of Steps: 1 Entrance Stairs-Rails: None Home Layout: One level     Bathroom Shower/Tub: Chief Strategy Officer: Standard Bathroom Accessibility: Yes How Accessible: Accessible via walker Home Equipment: Cane - single point;Rolling Walker (2 wheels);BSC/3in1;Shower seat;Grab bars - toilet;Grab bars - tub/shower          Prior Functioning/Environment Prior Level of Function : Independent/Modified Independent  Mobility Comments: No AD, denies falls      OT Problem List: Impaired balance (sitting and/or standing)        OT Goals(Current goals can be found in the care plan section)    Acute Rehab OT Goals Patient Stated Goal: To go home OT Goal Formulation: All assessment and education complete, DC therapy Time For Goal Achievement: 07/14/24 Potential to Achieve Goals: Good   AM-PAC OT 6 Clicks Daily Activity     Outcome Measure Help from another person eating meals?: None Help from another person taking care of personal grooming?: None Help from another person toileting, which includes using toliet, bedpan, or urinal?: None Help from another person bathing (including washing, rinsing, drying)?: None Help from another person to put on and taking off regular upper body clothing?: None Help from another person to put on and taking off regular lower body clothing?: None 6 Click Score: 24   End of Session Nurse Communication: Mobility status  Activity Tolerance: Patient tolerated treatment well Patient left: in bed;with call bell/phone within reach;with family/visitor present  OT Visit Diagnosis: Unsteadiness on feet (R26.81);Other abnormalities of gait and mobility (R26.89)                Time: 8361-8348 OT Time Calculation (min): 13 min Charges:  OT General Charges $OT Visit: 1 Visit OT Evaluation $OT Eval Moderate Complexity: 1 Mod  Adrianne BROCKS, OT  Acute Rehabilitation Services Office [phone] Secure chat preferred   Adrianne GORMAN Savers 06/30/2024, 5:06 PM

## 2024-06-30 NOTE — Plan of Care (Signed)
 Problem: Education: Goal: Knowledge of General Education information will improve Description: Including pain rating scale, medication(s)/side effects and non-pharmacologic comfort measures 06/30/2024 1921 by Sherleen Flor, RN Outcome: Completed/Met 06/30/2024 1804 by Sherleen Flor, RN Outcome: Progressing   Problem: Health Behavior/Discharge Planning: Goal: Ability to manage health-related needs will improve 06/30/2024 1921 by Sherleen Flor, RN Outcome: Completed/Met 06/30/2024 1804 by Sherleen Flor, RN Outcome: Progressing   Problem: Clinical Measurements: Goal: Ability to maintain clinical measurements within normal limits will improve 06/30/2024 1921 by Sherleen Flor, RN Outcome: Completed/Met 06/30/2024 1804 by Sherleen Flor, RN Outcome: Progressing Goal: Will remain free from infection 06/30/2024 1921 by Sherleen Flor, RN Outcome: Completed/Met 06/30/2024 1804 by Sherleen Flor, RN Outcome: Progressing Goal: Diagnostic test results will improve 06/30/2024 1921 by Sherleen Flor, RN Outcome: Completed/Met 06/30/2024 1804 by Sherleen Flor, RN Outcome: Progressing Goal: Respiratory complications will improve 06/30/2024 1921 by Sherleen Flor, RN Outcome: Completed/Met 06/30/2024 1804 by Sherleen Flor, RN Outcome: Progressing Goal: Cardiovascular complication will be avoided 06/30/2024 1921 by Sherleen Flor, RN Outcome: Completed/Met 06/30/2024 1804 by Sherleen Flor, RN Outcome: Progressing   Problem: Activity: Goal: Risk for activity intolerance will decrease 06/30/2024 1921 by Sherleen Flor, RN Outcome: Completed/Met 06/30/2024 1804 by Sherleen Flor, RN Outcome: Progressing   Problem: Nutrition: Goal: Adequate nutrition will be maintained 06/30/2024 1921 by Sherleen Flor, RN Outcome: Completed/Met 06/30/2024 1804 by Sherleen Flor, RN Outcome: Progressing   Problem: Coping: Goal: Level of anxiety will decrease 06/30/2024  1921 by Sherleen Flor, RN Outcome: Completed/Met 06/30/2024 1804 by Sherleen Flor, RN Outcome: Progressing   Problem: Elimination: Goal: Will not experience complications related to bowel motility 06/30/2024 1921 by Sherleen Flor, RN Outcome: Completed/Met 06/30/2024 1804 by Sherleen Flor, RN Outcome: Progressing Goal: Will not experience complications related to urinary retention 06/30/2024 1921 by Sherleen Flor, RN Outcome: Completed/Met 06/30/2024 1804 by Sherleen Flor, RN Outcome: Progressing   Problem: Pain Managment: Goal: General experience of comfort will improve and/or be controlled 06/30/2024 1921 by Sherleen Flor, RN Outcome: Completed/Met 06/30/2024 1804 by Sherleen Flor, RN Outcome: Progressing   Problem: Safety: Goal: Ability to remain free from injury will improve 06/30/2024 1921 by Sherleen Flor, RN Outcome: Completed/Met 06/30/2024 1804 by Sherleen Flor, RN Outcome: Progressing   Problem: Skin Integrity: Goal: Risk for impaired skin integrity will decrease 06/30/2024 1921 by Sherleen Flor, RN Outcome: Completed/Met 06/30/2024 1804 by Sherleen Flor, RN Outcome: Progressing   Problem: Education: Goal: Ability to describe self-care measures that may prevent or decrease complications (Diabetes Survival Skills Education) will improve 06/30/2024 1921 by Sherleen Flor, RN Outcome: Completed/Met 06/30/2024 1804 by Sherleen Flor, RN Outcome: Progressing Goal: Individualized Educational Video(s) 06/30/2024 1921 by Sherleen Flor, RN Outcome: Completed/Met 06/30/2024 1804 by Sherleen Flor, RN Outcome: Progressing   Problem: Coping: Goal: Ability to adjust to condition or change in health will improve 06/30/2024 1921 by Sherleen Flor, RN Outcome: Completed/Met 06/30/2024 1804 by Sherleen Flor, RN Outcome: Progressing   Problem: Fluid Volume: Goal: Ability to maintain a balanced intake and output will improve 06/30/2024  1921 by Sherleen Flor, RN Outcome: Completed/Met 06/30/2024 1804 by Sherleen Flor, RN Outcome: Progressing   Problem: Health Behavior/Discharge Planning: Goal: Ability to identify and utilize available resources and services will improve 06/30/2024 1921 by Sherleen Flor, RN Outcome: Completed/Met 06/30/2024 1804 by Sherleen Flor, RN Outcome: Progressing Goal: Ability to manage health-related needs will improve 06/30/2024 1921 by Sherleen Flor, RN Outcome: Completed/Met 06/30/2024 1804 by Sherleen Flor, RN Outcome: Progressing   Problem: Metabolic: Goal: Ability to maintain appropriate glucose  levels will improve 06/30/2024 1921 by Sherleen Flor, RN Outcome: Completed/Met 06/30/2024 1804 by Sherleen Flor, RN Outcome: Progressing   Problem: Nutritional: Goal: Maintenance of adequate nutrition will improve 06/30/2024 1921 by Sherleen Flor, RN Outcome: Completed/Met 06/30/2024 1804 by Sherleen Flor, RN Outcome: Progressing Goal: Progress toward achieving an optimal weight will improve 06/30/2024 1921 by Sherleen Flor, RN Outcome: Completed/Met 06/30/2024 1804 by Sherleen Flor, RN Outcome: Progressing   Problem: Skin Integrity: Goal: Risk for impaired skin integrity will decrease 06/30/2024 1921 by Sherleen Flor, RN Outcome: Completed/Met 06/30/2024 1804 by Sherleen Flor, RN Outcome: Progressing   Problem: Tissue Perfusion: Goal: Adequacy of tissue perfusion will improve 06/30/2024 1921 by Sherleen Flor, RN Outcome: Completed/Met 06/30/2024 1804 by Sherleen Flor, RN Outcome: Progressing   Problem: Education: Goal: Ability to verbalize activity precautions or restrictions will improve 06/30/2024 1921 by Sherleen Flor, RN Outcome: Completed/Met 06/30/2024 1804 by Sherleen Flor, RN Outcome: Progressing Goal: Knowledge of the prescribed therapeutic regimen will improve 06/30/2024 1921 by Sherleen Flor, RN Outcome:  Completed/Met 06/30/2024 1804 by Sherleen Flor, RN Outcome: Progressing Goal: Understanding of discharge needs will improve 06/30/2024 1921 by Sherleen Flor, RN Outcome: Completed/Met 06/30/2024 1804 by Sherleen Flor, RN Outcome: Progressing   Problem: Activity: Goal: Ability to avoid complications of mobility impairment will improve 06/30/2024 1921 by Sherleen Flor, RN Outcome: Completed/Met 06/30/2024 1804 by Sherleen Flor, RN Outcome: Progressing Goal: Ability to tolerate increased activity will improve 06/30/2024 1921 by Sherleen Flor, RN Outcome: Completed/Met 06/30/2024 1804 by Sherleen Flor, RN Outcome: Progressing Goal: Will remain free from falls 06/30/2024 1921 by Sherleen Flor, RN Outcome: Completed/Met 06/30/2024 1804 by Sherleen Flor, RN Outcome: Progressing   Problem: Bowel/Gastric: Goal: Gastrointestinal status for postoperative course will improve 06/30/2024 1921 by Sherleen Flor, RN Outcome: Completed/Met 06/30/2024 1804 by Sherleen Flor, RN Outcome: Progressing   Problem: Clinical Measurements: Goal: Ability to maintain clinical measurements within normal limits will improve 06/30/2024 1921 by Sherleen Flor, RN Outcome: Completed/Met 06/30/2024 1804 by Sherleen Flor, RN Outcome: Progressing Goal: Postoperative complications will be avoided or minimized 06/30/2024 1921 by Sherleen Flor, RN Outcome: Completed/Met 06/30/2024 1804 by Sherleen Flor, RN Outcome: Progressing Goal: Diagnostic test results will improve 06/30/2024 1921 by Sherleen Flor, RN Outcome: Completed/Met 06/30/2024 1804 by Sherleen Flor, RN Outcome: Progressing   Problem: Pain Management: Goal: Pain level will decrease 06/30/2024 1921 by Sherleen Flor, RN Outcome: Completed/Met 06/30/2024 1804 by Sherleen Flor, RN Outcome: Progressing   Problem: Skin Integrity: Goal: Will show signs of wound healing 06/30/2024 1921 by Sherleen Flor,  RN Outcome: Completed/Met 06/30/2024 1804 by Sherleen Flor, RN Outcome: Progressing   Problem: Health Behavior/Discharge Planning: Goal: Identification of resources available to assist in meeting health care needs will improve 06/30/2024 1921 by Sherleen Flor, RN Outcome: Completed/Met 06/30/2024 1804 by Sherleen Flor, RN Outcome: Progressing   Problem: Bladder/Genitourinary: Goal: Urinary functional status for postoperative course will improve 06/30/2024 1921 by Sherleen Flor, RN Outcome: Completed/Met 06/30/2024 1804 by Sherleen Flor, RN Outcome: Progressing

## 2024-06-30 NOTE — Discharge Summary (Signed)
 Physician Discharge Summary  Patient ID: Adam Roach MRN: [id number] DOB/AGE: 07-Nov-1955 68 y.o. Estimated body mass index is 27.12 kg/m as calculated from the following:   Height as of this encounter: 5' 5 (1.651 m).   Weight as of this encounter: 73.9 kg.   Admit date: 06/30/2024 Discharge date: 06/30/2024  Admission Diagnoses: Painful hardware possible pseudoarthrosis L5-S1  Discharge Diagnoses: Same Principal Problem:   Pseudoarthrosis of lumbar spine   Discharged Condition: good  Hospital Course: Patient admitted to hospital underwent reexploration fusion removal of hardware with cutting the rod removing bilateral S1 screws and redo posterolateral arthrodesis.  Postop patient very well covering the floor was on the floor ambulating and voiding spontaneously tolerating her diet stable for discharge home.  Patient be discharged scheduled follow-up in 1 to 2 weeks.  Consults: Significant Diagnostic Studies: Treatments: Removal bilateral S1 screws redo posterolateral arthrodesis Discharge Exam: Blood pressure (!) 159/74, pulse (!) 47, temperature 97.6 F (36.4 C), resp. rate 20, height 5' 5 (1.651 m), weight 73.9 kg, SpO2 98%. Strength 5 out of 5 and clean dry and intact  Disposition: Home   Allergies as of 06/30/2024       Reactions   Penicillins Anaphylaxis   Anaphylactic reaction, Happened in childhood   Vancomycin  Rash   Zetia [ezetimibe] Shortness Of Breath   Chest pain and breathing issues   Ciprofloxacin In D5w Rash   Contrast Media [iodinated Contrast Media] Hives   Per patient this was many years ago.   Ioxaglate Hives   Aspirin  Other (See Comments)   At full strength, stomach irritation   Ciprofloxacin Rash   Gabapentin  Hives, Rash   Ibuprofen Other (See Comments)   Stomach irritation   Repatha [evolocumab] Hives, Rash        Medication List     TAKE these medications    aspirin  EC 81 MG tablet Take 81 mg by mouth in the morning.    atenolol  50 MG tablet Commonly known as: TENORMIN  Take 50 mg by mouth every evening.   atorvastatin  80 MG tablet Commonly known as: LIPITOR Take 80 mg by mouth in the morning.   B-D UF III MINI PEN NEEDLES 31G X 5 MM Misc Generic drug: Insulin  Pen Needle SMARTSIG:Injection Daily   cyanocobalamin 1000 MCG tablet Commonly known as: VITAMIN B12 Take 1,000 mcg by mouth every evening.   cyclobenzaprine  10 MG tablet Commonly known as: FLEXERIL  Take 1 tablet (10 mg total) by mouth 3 (three) times daily as needed for muscle spasms.   ferrous sulfate 325 (65 FE) MG tablet Take 325 mg by mouth every evening.   HYDROcodone -acetaminophen  10-325 MG tablet Commonly known as: NORCO Take 1 tablet by mouth every 6 (six) hours as needed for moderate pain (pain score 4-6).   Journavx 50 MG Tabs Generic drug: Suzetrigine Take 50 mg by mouth every 12 (twelve) hours.   Lantus SoloStar 100 UNIT/ML Solostar Pen Generic drug: insulin  glargine Inject 12 Units into the skin every evening. (1900)   lisinopril  10 MG tablet Commonly known as: ZESTRIL  Take 10 mg by mouth in the morning.   magnesium  oxide 400 MG tablet Commonly known as: MAG-OX Take 400 mg by mouth at bedtime.   METAMUCIL FIBER PO Take 1 Scoop by mouth every evening.   nitroGLYCERIN  0.4 MG SL tablet Commonly known as: NITROSTAT  Place 0.4 mg under the tongue every 5 (five) minutes x 3 doses as needed for chest pain.   omeprazole 40 MG capsule Commonly known as: PRILOSEC  Take 40 mg by mouth daily.   prednisoLONE acetate 1 % ophthalmic suspension Commonly known as: PRED FORTE Place 1 drop into the left eye in the morning and at bedtime.   PRESERVISION AREDS 2 PO Take 1 tablet by mouth in the morning and at bedtime.   ranolazine  500 MG 12 hr tablet Commonly known as: RANEXA  Take 500 mg by mouth 2 (two) times daily.   sildenafil  20 MG tablet Commonly known as: REVATIO  Take 2-5 tablets by mouth as needed (sexual  disfunction).   traZODone  50 MG tablet Commonly known as: DESYREL  Take 100 mg by mouth at bedtime.         Signed: Arley SHAUNNA Helling 06/30/2024, 3:57 PM

## 2024-06-30 NOTE — H&P (Signed)
 Adam Roach is an 68 y.o. male.   Chief Complaint: Back pain HPI: 68 year old gentleman previously undergone multilevel lumbar fusion over successive operations both anterior and posterior presents with progressive worsening back pain centered around the lower part of his incision L5-S1 and SI joint.  Workup has revealed persistent haloing around his S1 screws but it looks like he has solid interbody bone in the interspace.  Certainly this implies at least at 1 point in incomplete fusion but it may have progressed on.  So due to his pain and the feel that this is probably related to toggle motion around the loose screws I recommended removal and I recommended cutting the rod below L5 removal of the S1 screws evaluate the fusion possibly lay down some additional posterolateral bone but no new hardware.  I have extensive gone over the risk and benefits of the operation with him as well as perioperative course expectations of outcome and alternatives to surgery and he understands and agrees to proceed forward.  Past Medical History:  Diagnosis Date   Arthritis    Cancer [facility]) 2012   Right clear cell renal cancer, s/p right partial nephrectomy 08/17/11   Coronary artery disease    inferior MI, s/p RCA stent 2002; DES ostimal ramus 08/19/19   Diabetes mellitus without complication (HCC)    Type 2   Dysrhythmia    afib, s/p ablation 09/14/20, no known recurrence (08/08/22)   GERD (gastroesophageal reflux disease)    Hyperlipemia    Hypertension    Low back pain    Myocardial infarction [facility]) 2002   RCA stent [facility])   Wears glasses     Past Surgical History:  Procedure Laterality Date   ABDOMINAL EXPOSURE N/A 04/14/2023   Procedure: ABDOMINAL EXPOSURE;  Surgeon: Gretta Lonni PARAS, MD;  Location: [facility] OR;  Service: Vascular;  Laterality: N/A;   ANTERIOR LUMBAR FUSION N/A 04/14/2023   Procedure: ANTERIOR LUMBAR INTERBODY FUSION - LUMBAR FIVE-SACRAL ONE with removal of hardware;   Surgeon: Onetha Kuba, MD;  Location: [facility] OR;  Service: Neurosurgery;  Laterality: N/A;   CARDIAC CATHETERIZATION     RCA stent 2002; DES ostial ramus 08/19/19   CARPAL TUNNEL RELEASE Right 12/20/2014   Procedure: RIGHT CARPAL TUNNEL RELEASE;  Surgeon: Franky Curia, MD;  Location: [facility];  Service: Orthopedics;  Laterality: Right;   ELBOW ARTHROSCOPY  09/30/2008   left   FINGER GANGLION CYST EXCISION     rt thumb   GANGLION CYST EXCISION     lt wrist   GANGLION CYST EXCISION Right 12/20/2014   Procedure: REMOVAL GANGLION OF WRIST;  Surgeon: Franky Curia, MD;  Location: [facility];  Service: Orthopedics;  Laterality: Right;   LAMINECTOMY WITH POSTERIOR LATERAL ARTHRODESIS LEVEL 1 N/A 04/16/2023   Procedure: Lumbar Five-Sacral One Posterior revision of pedicle screws and posterolateral arthrodesis;  Surgeon: Onetha Kuba, MD;  Location: [facility] OR;  Service: Neurosurgery;  Laterality: N/A;   PARTIAL NEPHRECTOMY  08/27/2011   right   POSTERIOR LUMBAR FUSION 4 LEVEL N/A 08/16/2022   Procedure: LUMBAR TWO-THREE, LUMBAR THREE-FOUR, LUMBAR FOUR-FIVE, LUMBAR FIVE-SACRAL ONE POSTERIOR LUMBAR INTERBODY FUSION;  Surgeon: Joshua Alm RAMAN, MD;  Location: [facility] OR;  Service: Neurosurgery;  Laterality: N/A;   SHOULDER ARTHROSCOPY  10/01/2007   left   TONSILLECTOMY     ULNAR NERVE REPAIR  09/30/2010   left   ULNAR NERVE TRANSPOSITION Right 12/20/2014   Procedure: RIGHT ULNAR NERVE DECOMPRESSION, TRANSPOSITION;  Surgeon: Franky Curia,  MD;  Location: [facility];  Service: Orthopedics;  Laterality: Right;    Family History  Problem Relation Age of Onset   Heart disease Father    Social History:  reports that he has never smoked. He has never used smokeless tobacco. He reports that he does not drink alcohol and does not use drugs.  Allergies:  Allergies  Allergen Reactions   Penicillins Anaphylaxis    Anaphylactic reaction, Happened in childhood   Vancomycin  Rash    Zetia [Ezetimibe] Shortness Of Breath    Chest pain and breathing issues   Ciprofloxacin In D5w Rash   Contrast Media [Iodinated Contrast Media] Hives    Per patient this was many years ago.   Ioxaglate Hives   Aspirin  Other (See Comments)    At full strength, stomach irritation   Ciprofloxacin Rash   Gabapentin  Hives and Rash   Ibuprofen Other (See Comments)    Stomach irritation    Repatha [Evolocumab] Hives and Rash    Medications Prior to Admission  Medication Sig Dispense Refill   aspirin  EC 81 MG tablet Take 81 mg by mouth in the morning.     atenolol  (TENORMIN ) 50 MG tablet Take 50 mg by mouth every evening.     atorvastatin  (LIPITOR) 80 MG tablet Take 80 mg by mouth in the morning.     cyanocobalamin (VITAMIN B12) 1000 MCG tablet Take 1,000 mcg by mouth every evening.     ferrous sulfate 325 (65 FE) MG tablet Take 325 mg by mouth every evening.     JOURNAVX 50 MG TABS Take 50 mg by mouth every 12 (twelve) hours.     LANTUS SOLOSTAR 100 UNIT/ML Solostar Pen Inject 12 Units into the skin every evening. (1900)     lisinopril  (ZESTRIL ) 10 MG tablet Take 10 mg by mouth in the morning.     magnesium  oxide (MAG-OX) 400 MG tablet Take 400 mg by mouth at bedtime.     METAMUCIL FIBER PO Take 1 Scoop by mouth every evening.     Multiple Vitamins-Minerals (PRESERVISION AREDS 2 PO) Take 1 tablet by mouth in the morning and at bedtime.     omeprazole (PRILOSEC) 40 MG capsule Take 40 mg by mouth daily.     prednisoLONE acetate (PRED FORTE) 1 % ophthalmic suspension Place 1 drop into the left eye in the morning and at bedtime.     ranolazine  (RANEXA ) 500 MG 12 hr tablet Take 500 mg by mouth 2 (two) times daily.     sildenafil  (REVATIO ) 20 MG tablet Take 2-5 tablets by mouth as needed (sexual disfunction).     traZODone  (DESYREL ) 50 MG tablet Take 100 mg by mouth at bedtime.  3   B-D UF III MINI PEN NEEDLES 31G X 5 MM MISC SMARTSIG:Injection Daily     nitroGLYCERIN  (NITROSTAT ) 0.4 MG SL  tablet Place 0.4 mg under the tongue every 5 (five) minutes x 3 doses as needed for chest pain.      Results for orders placed or performed during the hospital encounter of 06/30/24 (from the past 48 hours)  Glucose, capillary     Status: Abnormal   Collection Time: 06/30/24  8:57 AM  Result Value Ref Range   Glucose-Capillary 102 (H) 70 - 99 mg/dL    Comment: Glucose reference range applies only to samples taken after fasting for at least 8 hours.   No results found.  Review of Systems  Musculoskeletal:  Positive for back pain.  Blood pressure (!) 148/73, pulse (!) 48, temperature 98 F (36.7 C), temperature source Oral, resp. rate 17, height 5' 5 (1.651 m), weight 73.9 kg, SpO2 100%. Physical Exam HENT:     Head: Normocephalic.     Right Ear: Tympanic membrane normal.     Nose: Nose normal.  Cardiovascular:     Rate and Rhythm: Normal rate.     Pulses: Normal pulses.  Pulmonary:     Effort: Pulmonary effort is normal.  Musculoskeletal:        General: Normal range of motion.     Cervical back: Normal range of motion.  Neurological:     Mental Status: He is alert.     Comments: Strength 5-5 iliopsoas, quads, hamstrings, gastrocs, into tibialis, EHL.      Assessment/Plan 68 year old presents for removal of hardware possible redo posterolateral arthrodesis L5-S1  Arley SHAUNNA Helling, MD 06/30/2024, 11:02 AM

## 2024-06-30 NOTE — Op Note (Signed)
 Preoperative diagnosis: Bilateral loose S1 screws possible pseudoarthrosis L5-S1.  Postoperative diagnosis: Same.  Procedure: Exploration of fusion removal of hardware with cutting the rod below the L5 screws removal of bilateral S1 screws and redo posterolateral arthrodesis L5-S1 utilizing BMP Vivigen and locally harvested autograft  Surgeon: Arley Ruthene Methvin  Anesthesia: General.  EBL: Minimal.  HPI: 54 years gentleman previously undergone and L2-S1 fusion with both anterior and posterior construct patient was developing progressive worsening back pain workup revealed haloing around his S1 screws consistent with loosening which imply possible pseudoarthrosis although it did look like he had solid interbody bone from the anterior implant.  However due to the patient's progression of clinical syndrome imaging findings and failed conservative treatment I recommended exploration of fusion move of hardware and possible redo posterolateral arthrodesis.  I extensively reviewed the risks and benefits of the operation with the patient as well as perioperative course expectations of outcome and alternatives of surgery and he understood and agreed to proceed forward.  Operative procedure: Patient was brought into the OR was [facility] general anesthesia positioned prone the Wilson frame his back was prepped and draped in routine sterile fashion.  His old incision was infiltrated with 10 cc lidocaine  with epi and opened up the scar tissue was dissected free exposing the hardware at L5 and S1.  I then proceeded to cut the rod below the L5 screw remove the S1 screws bilaterally the fusion did appear to be solid although there was clearly some disc continuity to the posterolateral arthrodesis so I aggressively decorticated all the facets sacral ala and sacrum as well as facet and TP at L5 and laid some additional BMP Vivigen and the decorticated autograft posterolaterally from L5-S1.  I then maintain close hemostasis and  closed the wound in layers primarily with interrupted Vicryl running 4-0 subcuticular Dermabond benzoin Steri-Strips and sterile dressing.  At the end the case all needle count sponge counts were correct patient went to cover him in stable condition.

## 2024-06-30 NOTE — Anesthesia Procedure Notes (Signed)
 Procedure Name: Intubation Date/Time: 06/30/2024 12:17 PM  Performed by: Steen Clarita CROME, CRNAPre-anesthesia Checklist: Patient identified, Patient being monitored, Timeout performed, Emergency Drugs available and Suction available Patient Re-evaluated:Patient Re-evaluated prior to induction Oxygen Delivery Method: Circle System Utilized Preoxygenation: Pre-oxygenation with 100% oxygen Induction Type: IV induction Ventilation: Mask ventilation without difficulty Laryngoscope Size: Miller and 2 Grade View: Grade I Tube type: Oral Tube size: 7.5 mm Number of attempts: 1 Airway Equipment and Method: stylet Placement Confirmation: ETT inserted through vocal cords under direct vision, positive ETCO2 and breath sounds checked- equal and bilateral Secured at: 21 cm Tube secured with: Tape Dental Injury: Teeth and Oropharynx as per pre-operative assessment

## 2024-06-30 NOTE — Discharge Instructions (Signed)
 Wound Care Keep incision covered and dry until post op day 3. You may remove the Honeycomb dressing on post op day 3. Leave steri-strips on back.  They will fall off by themselves. Do not put any creams, lotions, or ointments on incision. You are fine to shower. Let water run over incision and pat dry.   Activity Walk each and every day, increasing distance each day. No lifting greater than 8 lbs.  No lifting no bending no twisting no driving, or riding unless coming to see the Dr. If provided with back brace, wear when out of bed.  It is not necessary to wear brace in bed.  Diet Resume your normal diet.    Call Your Doctor If Any of These Occur Redness, drainage, or swelling at the wound.  Temperature greater than 101 degrees. Severe pain not relieved by pain medication. Incision starts to come apart.  Follow Up Appt Call [phone] if you have one or any problem.

## 2024-07-01 ENCOUNTER — Encounter (HOSPITAL_COMMUNITY): Payer: Self-pay | Admitting: Neurosurgery

## 2024-07-16 NOTE — Anesthesia Postprocedure Evaluation (Signed)
 Anesthesia Post Note  Patient: Adam Roach  Procedure(s) Performed: LAMINECTOMY WITH POSTERIOR LATERAL ARTHRODESIS LUMBAR FIVE-SACRAL ONE (Back)     Patient location during evaluation: PACU Anesthesia Type: General Level of consciousness: awake Pain management: pain level controlled Vital Signs Assessment: post-procedure vital signs reviewed and stable Respiratory status: spontaneous breathing, nonlabored ventilation and respiratory function stable Cardiovascular status: blood pressure returned to baseline and stable Postop Assessment: no apparent nausea or vomiting Anesthetic complications: no   No notable events documented.  Last Vitals:  Vitals:   06/30/24 1500 06/30/24 1525  BP: 139/64 (!) 159/74  Pulse: (!) 46 (!) 47  Resp: 14 20  Temp: 36.4 C   SpO2: 94% 98%    Last Pain:  Vitals:   06/30/24 1731  TempSrc:   PainSc: 5                  Analeia Ismael P Parth Mccormac
